# Patient Record
Sex: Male | Born: 1980 | Race: White | Hispanic: No | Marital: Single | Smoking: Never smoker
Health system: Southern US, Community
[De-identification: ages and names within clinical notes are randomized; demographics above are authoritative.]

---

## 2011-03-19 DIAGNOSIS — K7581 Nonalcoholic steatohepatitis (NASH): Secondary | ICD-10-CM

## 2011-03-19 DIAGNOSIS — I10 Essential (primary) hypertension: Secondary | ICD-10-CM

## 2011-03-19 HISTORY — DX: Essential (primary) hypertension: I10

## 2011-03-19 HISTORY — DX: Nonalcoholic steatohepatitis (NASH): K75.81

## 2011-04-19 DIAGNOSIS — R6889 Other general symptoms and signs: Secondary | ICD-10-CM | POA: Insufficient documentation

## 2011-05-05 ENCOUNTER — Emergency Department (HOSPITAL_COMMUNITY)
Admission: EM | Admit: 2011-05-05 | Discharge: 2011-05-05 | Disposition: A | Discharge status: Home / Self Care | Payer: BC Managed Care – PPO | Attending: Emergency Medicine | Admitting: Emergency Medicine

## 2011-05-05 ENCOUNTER — Encounter (HOSPITAL_COMMUNITY): Payer: Self-pay | Admitting: *Deleted

## 2011-05-05 ENCOUNTER — Emergency Department (HOSPITAL_COMMUNITY): Payer: BC Managed Care – PPO

## 2011-05-05 DIAGNOSIS — R109 Unspecified abdominal pain: Secondary | ICD-10-CM | POA: Insufficient documentation

## 2011-05-05 DIAGNOSIS — K2990 Gastroduodenitis, unspecified, without bleeding: Secondary | ICD-10-CM | POA: Insufficient documentation

## 2011-05-05 DIAGNOSIS — K2970 Gastritis, unspecified, without bleeding: Secondary | ICD-10-CM | POA: Insufficient documentation

## 2011-05-05 DIAGNOSIS — R10819 Abdominal tenderness, unspecified site: Secondary | ICD-10-CM | POA: Insufficient documentation

## 2011-05-05 DIAGNOSIS — R110 Nausea: Secondary | ICD-10-CM | POA: Insufficient documentation

## 2011-05-05 DIAGNOSIS — Z79899 Other long term (current) drug therapy: Secondary | ICD-10-CM | POA: Insufficient documentation

## 2011-05-05 LAB — URINALYSIS, ROUTINE W REFLEX MICROSCOPIC
Leukocytes, UA: NEGATIVE
Nitrite: NEGATIVE
Specific Gravity, Urine: 1.01 (ref 1.005–1.030)
pH: 7 (ref 5.0–8.0)

## 2011-05-05 LAB — COMPREHENSIVE METABOLIC PANEL
Albumin: 4.8 g/dL (ref 3.5–5.2)
Alkaline Phosphatase: 55 U/L (ref 39–117)
BUN: 14 mg/dL (ref 6–23)
Creatinine, Ser: 0.87 mg/dL (ref 0.50–1.35)
Potassium: 3.9 mEq/L (ref 3.5–5.1)
Total Protein: 7.8 g/dL (ref 6.0–8.3)

## 2011-05-05 LAB — CBC
Hemoglobin: 15.4 g/dL (ref 13.0–17.0)
RBC: 5.06 MIL/uL (ref 4.22–5.81)
WBC: 6.6 10*3/uL (ref 4.0–10.5)

## 2011-05-05 LAB — LIPASE, BLOOD: Lipase: 21 U/L (ref 11–59)

## 2011-05-05 MED ORDER — TRAMADOL HCL 50 MG PO TABS: 50.0000 mg | ORAL_TABLET | Freq: Four times a day (QID) | ORAL | Status: AC | PRN

## 2011-05-05 MED ORDER — ONDANSETRON HCL 4 MG PO TABS: 4.0000 mg | ORAL_TABLET | Freq: Four times a day (QID) | ORAL | Status: AC

## 2011-05-05 MED ORDER — GI COCKTAIL ~~LOC~~
30.0000 mL | Freq: Once | ORAL | Status: AC
  Administered 2011-05-05: 30 mL via ORAL
  Filled 2011-05-05: qty 30

## 2011-05-05 MED ORDER — PANTOPRAZOLE SODIUM 20 MG PO TBEC: 40.0000 mg | DELAYED_RELEASE_TABLET | Freq: Every day | ORAL | Status: DC

## 2011-05-05 MED ORDER — PANTOPRAZOLE SODIUM 40 MG IV SOLR
40.0000 mg | Freq: Once | INTRAVENOUS | Status: AC
  Administered 2011-05-05: 40 mg via INTRAVENOUS
  Filled 2011-05-05: qty 40

## 2011-05-05 NOTE — ED Notes (Signed)
MD at bedside. 

## 2011-05-05 NOTE — ED Notes (Signed)
Patient given discharge instructions, information, prescriptions, and diet order. Patient states that they adequately understand discharge information given and to return to ED if symptoms return or worsen.     

## 2011-05-05 NOTE — ED Provider Notes (Signed)
History     CSN: [id number]  Arrival date & time 05/05/11  1030   First MD Initiated Contact with Patient 05/05/11 1046      Chief Complaint  Patient presents with  . Abdominal Pain    (Consider location/radiation/quality/duration/timing/severity/associated sxs/prior treatment) The history is provided by the patient.  Pt p/w epigastric and RUQ pain x 3 days worse when eating and associated with mild nausea. No fever, chills, V/D, urinary symptoms. ? Dark stools. No frank blood in stool. Pain mild currently. No prev abdominal surgeries.   History reviewed. No pertinent past medical history.  History reviewed. No pertinent past surgical history.  History reviewed. No pertinent family history.  History  Substance Use Topics  . Smoking status: Never Smoker   . Smokeless tobacco: Not on file  . Alcohol Use: Yes     social      Review of Systems  Constitutional: Negative for fever and chills.  Respiratory: Negative for chest tightness and shortness of breath.   Cardiovascular: Negative for chest pain and palpitations.  Gastrointestinal: Positive for abdominal pain. Negative for nausea, vomiting, diarrhea, constipation, blood in stool and abdominal distention.  Musculoskeletal: Negative for myalgias, back pain and arthralgias.  Skin: Negative for color change and rash.  Neurological: Negative for weakness, numbness and headaches.    Allergies  Review of patient's allergies indicates no known allergies.  Home Medications   Current Outpatient Rx  Name Route Sig Dispense Refill  . ACETAMINOPHEN 500 MG PO TABS Oral Take 1,000 mg by mouth every 6 (six) hours as needed. For pain.    Marland Kitchen OMEGA-3 FATTY ACIDS 1000 MG PO CAPS Oral Take 1 g by mouth 3 (three) times daily.    Marland Kitchen FLAX SEED OIL PO Oral Take 1 capsule by mouth 3 (three) times daily.    . IBUPROFEN 200 MG PO TABS Oral Take 400-600 mg by mouth every 8 (eight) hours as needed. For pain.    Marland Kitchen MILK THISTLE PO Oral Take 1  capsule by mouth 3 (three) times daily.    . ADULT MULTIVITAMIN W/MINERALS CH Oral Take 1 tablet by mouth daily.    Marland Kitchen ONDANSETRON HCL 4 MG PO TABS Oral Take 1 tablet (4 mg total) by mouth every 6 (six) hours. 12 tablet 0  . PANTOPRAZOLE SODIUM 20 MG PO TBEC Oral Take 2 tablets (40 mg total) by mouth daily. 30 tablet 0  . TRAMADOL HCL 50 MG PO TABS Oral Take 1 tablet (50 mg total) by mouth every 6 (six) hours as needed for pain. 15 tablet 0    BP 146/75  Pulse 86  Temp(Src) 97.6 F (36.4 C) (Oral)  Resp 18  SpO2 100%  Physical Exam  Nursing note and vitals reviewed. Constitutional: He is oriented to person, place, and time. He appears well-developed and well-nourished. No distress.  HENT:  Head: Normocephalic and atraumatic.  Mouth/Throat: Oropharynx is clear and moist.  Eyes: EOM are normal. Pupils are equal, round, and reactive to light.  Neck: Normal range of motion. Neck supple.  Cardiovascular: Normal rate and regular rhythm.   Pulmonary/Chest: Effort normal and breath sounds normal. No respiratory distress. He has no wheezes. He has no rales.  Abdominal: Soft. Bowel sounds are normal. He exhibits no distension and no mass. There is tenderness (Tenderness just superior to umbilicus and RUQ. no rebound or guarding). There is no rebound and no guarding.  Genitourinary: Rectum normal. Guaiac negative stool.  Musculoskeletal: Normal range of motion. He exhibits  no edema and no tenderness.  Neurological: He is alert and oriented to person, place, and time.  Skin: Skin is warm and dry. No rash noted. No erythema.  Psychiatric: He has a normal mood and affect. His behavior is normal.    ED Course  Procedures (including critical care time)  Labs Reviewed  COMPREHENSIVE METABOLIC PANEL - Abnormal; Notable for the following:    Glucose, Bld 103 (*)    All other components within normal limits  OCCULT BLOOD, POC DEVICE  LIPASE, BLOOD  URINALYSIS, ROUTINE W REFLEX MICROSCOPIC  CBC    CBC  DIFFERENTIAL  OCCULT BLOOD X 1 CARD TO LAB, STOOL   US Abdomen Complete  05/05/2011  *RADIOLOGY REPORT*  Clinical Data:  Right upper quadrant pain epigastric pain.  Nausea.  COMPLETE ABDOMINAL ULTRASOUND  Comparison:  None.  Findings:  Gallbladder:  No gallstones, gallbladder wall thickening, or pericholecystic fluid.  Common bile duct:  Between 4 mm and 5 mm, normal.  Liver:  Echogenic liver with mildly heterogeneous echotexture, most compatible with hepatic steatosis.  No focal mass lesions.  No intrahepatic biliary ductal dilation.  IVC:  Appears normal.  Pancreas:  Obscured by overlying bowel gas.  Spleen:  46 mm with normal echotexture.  Right Kidney:  11.3 cm. Normal echotexture.  Normal central sinus echo complex.  No calculi or hydronephrosis.  Left Kidney:  11.9 cm.  Probable extrarenal pelvis.  Normal echotexture.  No calculi.  Abdominal aorta:  2 cm, normal.  IMPRESSION:  1.  Negative for cholelithiasis or cholecystitis. 2.  Echogenic liver, likely representing hepatic steatosis.  Original Report Authenticated By: Andreas Newport, M.D.     1. Gastritis       MDM    Pt states he is feeling better. F/u with gi if sx persist. Return for concerns  Loren Racer, MD 05/05/11 [phone]

## 2011-05-05 NOTE — ED Notes (Signed)
Ultrasound tech in to perform abdominal ultrasound.

## 2011-05-05 NOTE — ED Notes (Signed)
Pt sts he was recently treated for stomach flu 2 weeks ago. Sts abd pain began Friday and got more intense last night. This morning sts he had BM and ate fiber bar but that the pain is still present. Pain above umbilicus and radiates to his right lower abdomen.

## 2011-07-15 ENCOUNTER — Ambulatory Visit: Payer: BC Managed Care – PPO

## 2011-07-15 ENCOUNTER — Ambulatory Visit (INDEPENDENT_AMBULATORY_CARE_PROVIDER_SITE_OTHER): Payer: BC Managed Care – PPO | Admitting: Emergency Medicine

## 2011-07-15 VITALS — BP 158/79 | HR 80 | Temp 98.4°F | Resp 18 | Ht 71.75 in | Wt 226.8 lb

## 2011-07-15 DIAGNOSIS — Z0000 Encounter for general adult medical examination without abnormal findings: Secondary | ICD-10-CM

## 2011-07-15 LAB — LIPID PANEL
Cholesterol: 200 mg/dL (ref 0–200)
LDL Cholesterol: 120 mg/dL — ABNORMAL HIGH (ref 0–99)
VLDL: 29 mg/dL (ref 0–40)

## 2011-07-15 LAB — POCT URINALYSIS DIPSTICK
Blood, UA: NEGATIVE
Glucose, UA: NEGATIVE
Nitrite, UA: NEGATIVE
Protein, UA: NEGATIVE
Urobilinogen, UA: 0.2

## 2011-07-15 LAB — COMPREHENSIVE METABOLIC PANEL
ALT: 18 U/L (ref 0–53)
AST: 21 U/L (ref 0–37)
Albumin: 5.3 g/dL — ABNORMAL HIGH (ref 3.5–5.2)
Alkaline Phosphatase: 54 U/L (ref 39–117)
BUN: 13 mg/dL (ref 6–23)
Calcium: 9.9 mg/dL (ref 8.4–10.5)
Chloride: 102 mEq/L (ref 96–112)
Potassium: 4.1 mEq/L (ref 3.5–5.3)
Sodium: 139 mEq/L (ref 135–145)
Total Protein: 7.6 g/dL (ref 6.0–8.3)

## 2011-07-15 LAB — POCT CBC
Lymph, poc: 1.5 (ref 0.6–3.4)
MCH, POC: 30.5 pg (ref 27–31.2)
MCHC: 34 g/dL (ref 31.8–35.4)
MID (cbc): 0.4 (ref 0–0.9)
MPV: 9.7 fL (ref 0–99.8)
POC MID %: 8 %M (ref 0–12)
Platelet Count, POC: 190 10*3/uL (ref 142–424)
WBC: 5.6 10*3/uL (ref 4.6–10.2)

## 2011-07-15 LAB — POCT UA - MICROSCOPIC ONLY
Casts, Ur, LPF, POC: NEGATIVE
Crystals, Ur, HPF, POC: NEGATIVE
Mucus, UA: NEGATIVE
Yeast, UA: NEGATIVE

## 2011-07-15 MED ORDER — OMEPRAZOLE 20 MG PO CPDR: 20.0000 mg | DELAYED_RELEASE_CAPSULE | Freq: Every day | ORAL | Status: DC

## 2011-07-15 NOTE — Patient Instructions (Addendum)
Hypertension As your heart beats, it forces blood through your arteries. This force is your blood pressure. If the pressure is too high, it is called hypertension (HTN) or high blood pressure. HTN is dangerous because you may have it and not know it. High blood pressure may mean that your heart has to work harder to pump blood. Your arteries may be narrow or stiff. The extra work puts you at risk for heart disease, stroke, and other problems.  Blood pressure consists of two numbers, a higher number over a lower, 110/72, for example. It is stated as "110 over 72." The ideal is below 120 for the top number (systolic) and under 80 for the bottom (diastolic). Write down your blood pressure today. You should pay close attention to your blood pressure if you have certain conditions such as:  Heart failure.   Prior heart attack.   Diabetes   Chronic kidney disease.   Prior stroke.   Multiple risk factors for heart disease.  To see if you have HTN, your blood pressure should be measured while you are seated with your arm held at the level of the heart. It should be measured at least twice. A one-time elevated blood pressure reading (especially in the Emergency Department) does not mean that you need treatment. There may be conditions in which the blood pressure is different between your right and left arms. It is important to see your caregiver soon for a recheck. Most people have essential hypertension which means that there is not a specific cause. This type of high blood pressure may be lowered by changing lifestyle factors such as:  Stress.   Smoking.   Lack of exercise.   Excessive weight.   Drug/tobacco/alcohol use.   Eating less salt.  Most people do not have symptoms from high blood pressure until it has caused damage to the body. Effective treatment can often prevent, delay or reduce that damage. TREATMENT  When a cause has been identified, treatment for high blood pressure is  directed at the cause. There are a large number of medications to treat HTN. These fall into several categories, and your caregiver will help you select the medicines that are best for you. Medications may have side effects. You should review side effects with your caregiver. If your blood pressure stays high after you have made lifestyle changes or started on medicines,   Your medication(s) may need to be changed.   Other problems may need to be addressed.   Be certain you understand your prescriptions, and know how and when to take your medicine.   Be sure to follow up with your caregiver within the time frame advised (usually within two weeks) to have your blood pressure rechecked and to review your medications.   If you are taking more than one medicine to lower your blood pressure, make sure you know how and at what times they should be taken. Taking two medicines at the same time can result in blood pressure that is too low.  SEEK IMMEDIATE MEDICAL CARE IF:  You develop a severe headache, blurred or changing vision, or confusion.   You have unusual weakness or numbness, or a faint feeling.   You have severe chest or abdominal pain, vomiting, or breathing problems.  MAKE SURE YOU:   Understand these instructions.   Will watch your condition.   Will get help right away if you are not doing well or get worse.  Document Released: 03/04/2005 Document Revised: 02/21/2011 Document Reviewed:   10/23/2007 [employer] Patient Information 2012 [place], [place].Gastroesophageal Reflux Disease, Adult Gastroesophageal reflux disease (GERD) happens when acid from your stomach flows up into the esophagus. When acid comes in contact with the esophagus, the acid causes soreness (inflammation) in the esophagus. Over time, GERD may create small holes (ulcers) in the lining of the esophagus. CAUSES   Increased body weight. This puts pressure on the stomach, making acid rise from the stomach into the  esophagus.   Smoking. This increases acid production in the stomach.   Drinking alcohol. This causes decreased pressure in the lower esophageal sphincter (valve or ring of muscle between the esophagus and stomach), allowing acid from the stomach into the esophagus.   Late evening meals and a full stomach. This increases pressure and acid production in the stomach.   A malformed lower esophageal sphincter.  Sometimes, no cause is found. SYMPTOMS   Burning pain in the lower part of the mid-chest behind the breastbone and in the mid-stomach area. This may occur twice a week or more often.   Trouble swallowing.   Sore throat.   Dry cough.   Asthma-like symptoms including chest tightness, shortness of breath, or wheezing.  DIAGNOSIS  Your caregiver may be able to diagnose GERD based on your symptoms. In some cases, X-rays and other tests may be done to check for complications or to check the condition of your stomach and esophagus. TREATMENT  Your caregiver may recommend over-the-counter or prescription medicines to help decrease acid production. Ask your caregiver before starting or adding any new medicines.  HOME CARE INSTRUCTIONS   Change the factors that you can control. Ask your caregiver for guidance concerning weight loss, quitting smoking, and alcohol consumption.   Avoid foods and drinks that make your symptoms worse, such as:   Caffeine or alcoholic drinks.   Chocolate.   Peppermint or mint flavorings.   Garlic and onions.   Spicy foods.   Citrus fruits, such as oranges, lemons, or limes.   Tomato-based foods such as sauce, chili, salsa, and pizza.   Fried and fatty foods.   Avoid lying down for the 3 hours prior to your bedtime or prior to taking a nap.   Eat small, frequent meals instead of large meals.   Wear loose-fitting clothing. Do not wear anything tight around your waist that causes pressure on your stomach.   Raise the head of your bed 6 to 8 inches  with wood blocks to help you sleep. Extra pillows will not help.   Only take over-the-counter or prescription medicines for pain, discomfort, or fever as directed by your caregiver.   Do not take aspirin, ibuprofen, or other nonsteroidal anti-inflammatory drugs (NSAIDs).  SEEK IMMEDIATE MEDICAL CARE IF:   You have pain in your arms, neck, jaw, teeth, or back.   Your pain increases or changes in intensity or duration.   You develop nausea, vomiting, or sweating (diaphoresis).   You develop shortness of breath, or you faint.   Your vomit is green, yellow, black, or looks like coffee grounds or blood.   Your stool is red, bloody, or black.  These symptoms could be signs of other problems, such as heart disease, gastric bleeding, or esophageal bleeding. MAKE SURE YOU:   Understand these instructions.   Will watch your condition.   Will get help right away if you are not doing well or get worse.  Document Released: 12/12/2004 Document Revised: 02/21/2011 Document Reviewed: 09/21/2010 [facility] Patient Information 2012 [place], [place].

## 2011-07-15 NOTE — Progress Notes (Signed)
Subjective:    Patient ID: Erik Boyd, male    DOB: [birth year], 31 y.o.   MRN: [id number]  HPI patient here for his annual physical exam. He's been told in the past he needs to lose weight exercise to keep his blood pressure down. He is a bad family history of hypertension high cholesterol and diabetes and knows he is at risk for these medical problems. He has been fairly sedentary recently because he has to travel 3-4 days a week and has been staying in hotels.    Review of Systems  Constitutional: Negative.   HENT: Negative.   Eyes: Negative.   Respiratory: Negative.   Cardiovascular:       He has been experiencing an upper chest discomfort associated with dyspepsia primarily in the morning. He's been to the emergency room for evaluation and was started on Protonix which she took for approximately 10 days.  Gastrointestinal:       As stated above he has been to the emergency room in the reflux esophagitis with Protonix for 10 days. He improved with this medication but symptoms returned immediately upon stopping the medication.       Objective:   Physical Exam  Constitutional: He appears well-developed and well-nourished.  HENT:  Head: Normocephalic.  Right Ear: External ear normal.  Left Ear: External ear normal.  Eyes: Pupils are equal, round, and reactive to light.  Neck: No tracheal deviation present. No thyromegaly present.  Cardiovascular: Normal rate and regular rhythm.  Exam reveals no gallop and no friction rub.   No murmur heard. Pulmonary/Chest: No respiratory distress. He has no rales. He exhibits no tenderness.  Abdominal: Soft. Bowel sounds are normal. He exhibits no distension and no mass. There is no tenderness. There is no guarding.  Genitourinary: Penis normal.  Musculoskeletal: He exhibits no edema.  Neurological: He is alert. No cranial nerve deficit.  Skin: Skin is dry.  Psychiatric: He has a normal mood and affect. His behavior is normal. Judgment and  thought content normal.  [facility] reading (PRIMARY) by  Dr.Berlyn Malina no acute disease .   Results for orders placed in visit on 07/15/11  POCT CBC      Component Value Range   WBC 5.6  4.6 - 10.2 (K/uL)   Lymph, poc 1.5  0.6 - 3.4    POC LYMPH PERCENT 26.5  10 - 50 (%L)   MID (cbc) 0.4  0 - 0.9    POC MID % 8.0  0 - 12 (%M)   POC Granulocyte 3.7  2 - 6.9    Granulocyte percent 65.5  37 - 80 (%G)   RBC 4.75  4.69 - 6.13 (M/uL)   Hemoglobin 14.5  14.1 - 18.1 (g/dL)   HCT, POC 22.0 (*) 25.4 - 53.7 (%)   MCV 89.7  80 - 97 (fL)   MCH, POC 30.5  27 - 31.2 (pg)   MCHC 34.0  31.8 - 35.4 (g/dL)   RDW, POC 27.0     Platelet Count, POC 190  142 - 424 (K/uL)   MPV 9.7  0 - 99.8 (fL)  POCT UA - MICROSCOPIC ONLY      Component Value Range   WBC, Ur, HPF, POC neg     RBC, urine, microscopic neg     Bacteria, U Microscopic neg     Mucus, UA neg     Epithelial cells, urine per micros 0-2     Crystals, Ur, HPF, POC neg  Casts, Ur, LPF, POC neg     Yeast, UA neg    POCT URINALYSIS DIPSTICK      Component Value Range   Color, UA yellow     Clarity, UA clear     Glucose, UA neg     Bilirubin, UA neg     Ketones, UA neg     Spec Grav, UA 1.015     Blood, UA neg     pH, UA 7.5     Protein, UA neg     Urobilinogen, UA 0.2     Nitrite, UA neg     Leukocytes, UA Negative          Assessment & Plan:   Patient is in reasonably good health except he is not exercising enough needs to lose weight and follow a stricter diet. He is aware he needs to do all these veins he just has not been able to do those things because of his work schedule.Marland Kitchen

## 2011-07-16 ENCOUNTER — Encounter: Payer: Self-pay | Admitting: *Deleted

## 2011-07-22 ENCOUNTER — Ambulatory Visit (INDEPENDENT_AMBULATORY_CARE_PROVIDER_SITE_OTHER): Payer: BC Managed Care – PPO | Admitting: Family Medicine

## 2011-07-22 VITALS — BP 150/87 | HR 94 | Temp 98.3°F | Resp 18 | Ht 71.5 in | Wt 224.4 lb

## 2011-07-22 DIAGNOSIS — H6690 Otitis media, unspecified, unspecified ear: Secondary | ICD-10-CM

## 2011-07-22 DIAGNOSIS — J329 Chronic sinusitis, unspecified: Secondary | ICD-10-CM

## 2011-07-22 MED ORDER — AMOXICILLIN 875 MG PO TABS: 875.0000 mg | ORAL_TABLET | Freq: Two times a day (BID) | ORAL | Status: AC

## 2011-07-22 NOTE — Progress Notes (Signed)
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 These symptoms are associated with a low-grade fever.  No nausea or vomiting  Objective: HEENT: Patient has reddened throat, postnasal drainage, and reddened right year.  Neck: Supple no adenopathy or thyromegaly  Chest: Clear  Heart: Regular no murmur  Skin: Clear  Assessment: Sinusitis and otitis media, progressive  Plan amoxicillin 875 twice a day x10 days

## 2012-06-01 ENCOUNTER — Telehealth: Payer: Self-pay

## 2012-06-01 NOTE — Telephone Encounter (Signed)
Complete form stating pt had PE/labs on 07/15/11 and signed by Dr Cleta Alberts. LMOM for pt that ready for p/up.

## 2012-12-23 IMAGING — US US ABDOMEN COMPLETE
1 series · 14 of 25 positions shown · non-contrast
Comparison: None.

CLINICAL DATA: Right upper quadrant pain epigastric pain.  Nausea.

COMPLETE ABDOMINAL ULTRASOUND

[Series 1: us abdomen complete · 0.32mm/px · 14 of 76 slices shown]
[im 1/76]
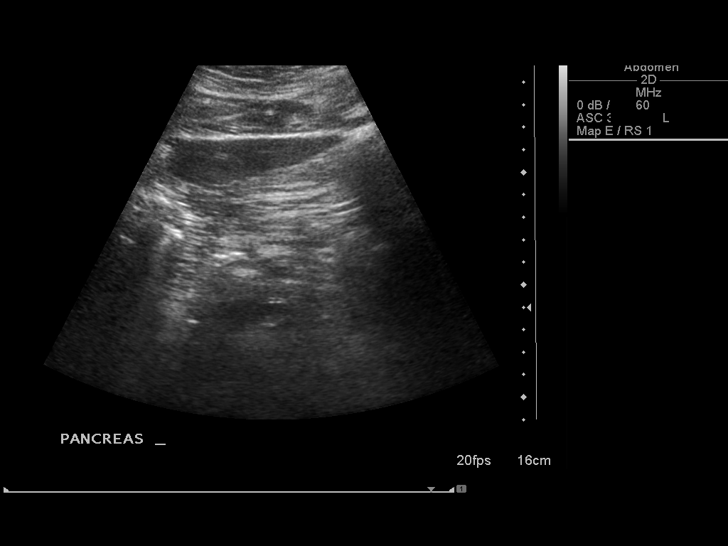
[im 7/76]
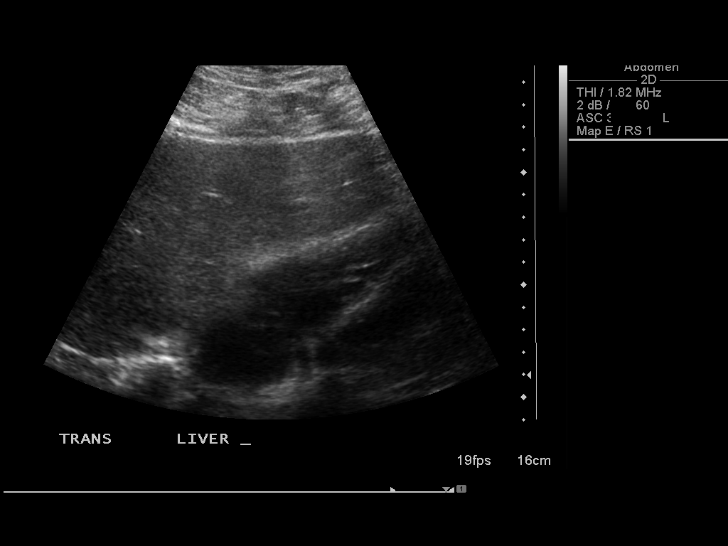
[im 13/76]
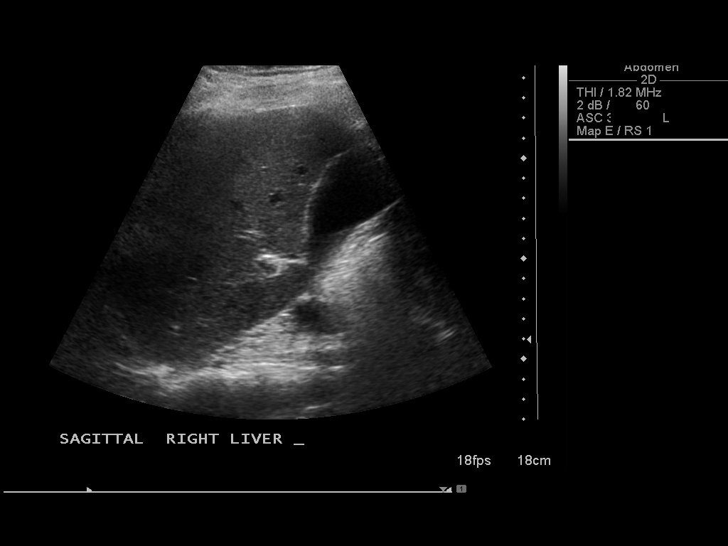
[im 19/76]
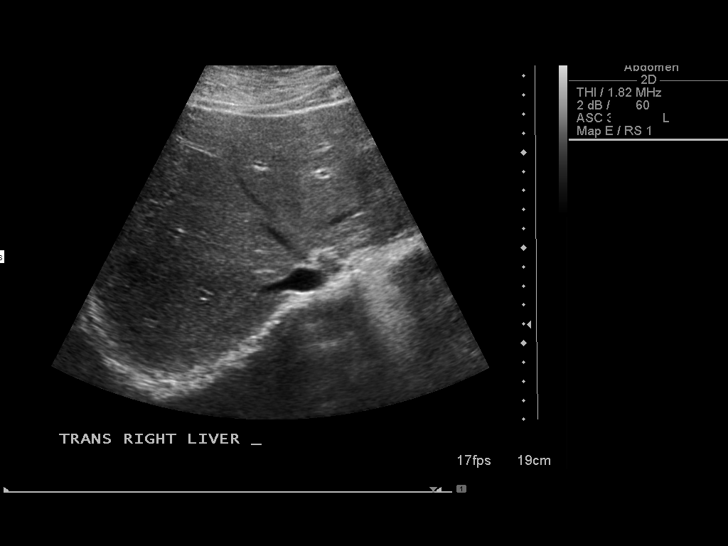
[im 26/76]
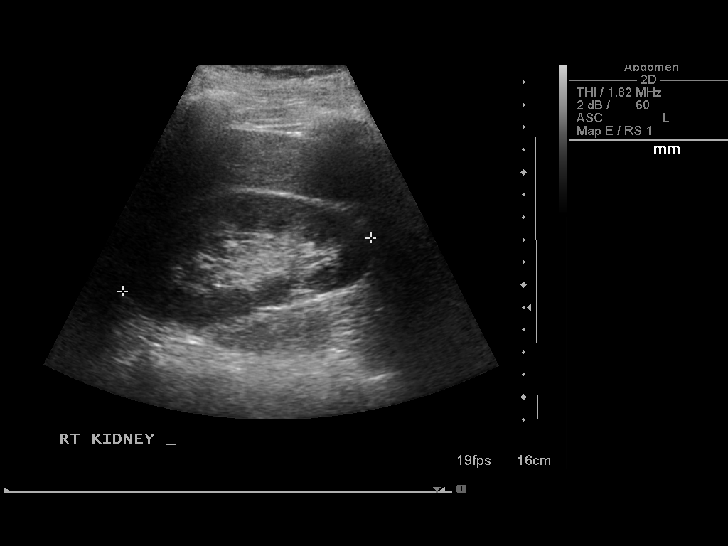
[im 29/76]
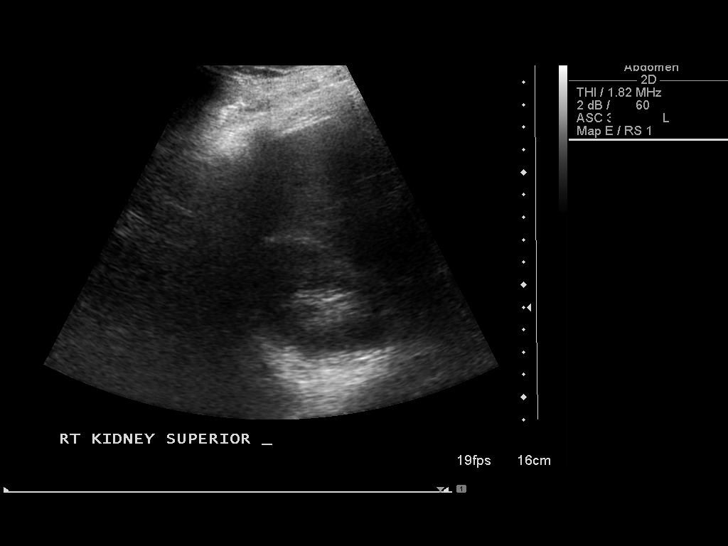
[im 35/76]
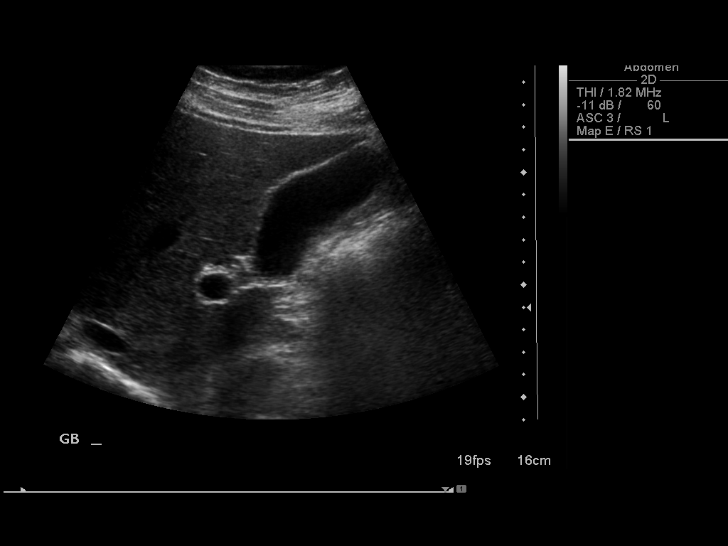
[im 41/76]
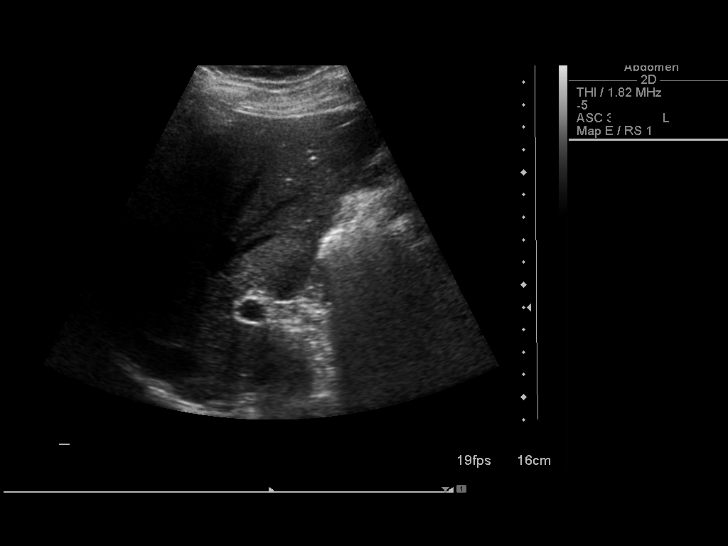
[im 47/76]
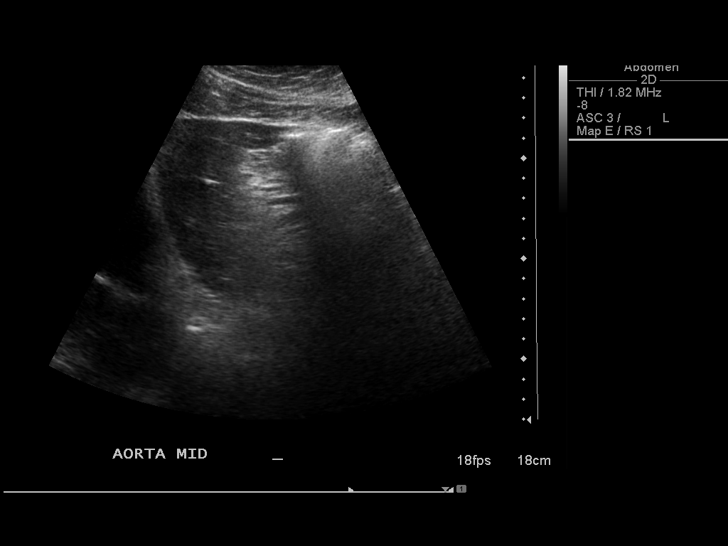
[im 51/76]
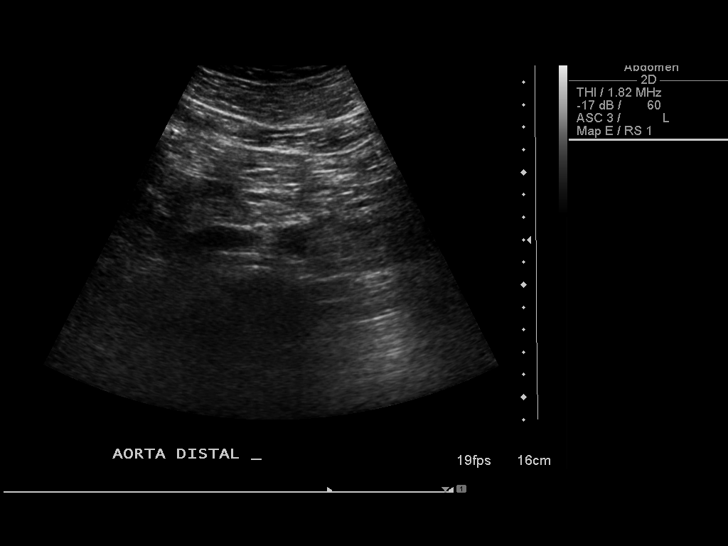
[im 57/76]
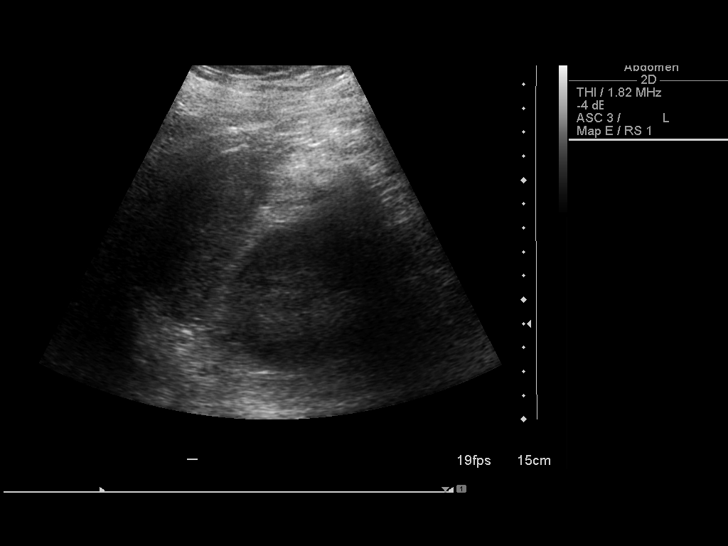
[im 63/76]
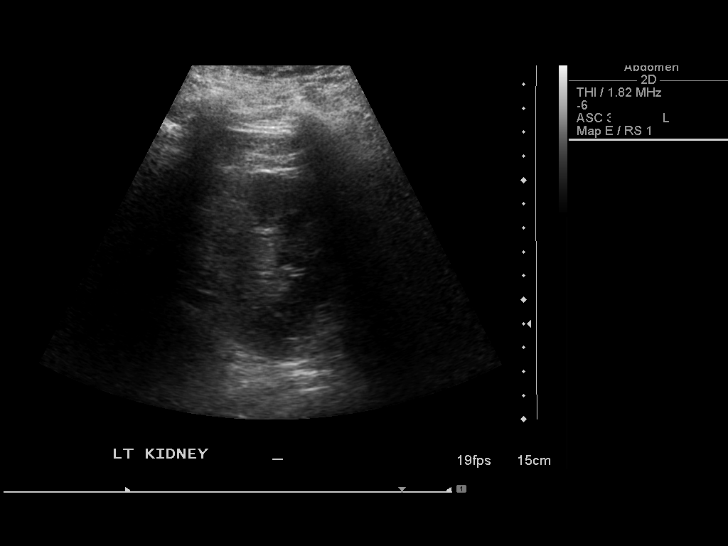
[im 69/76]
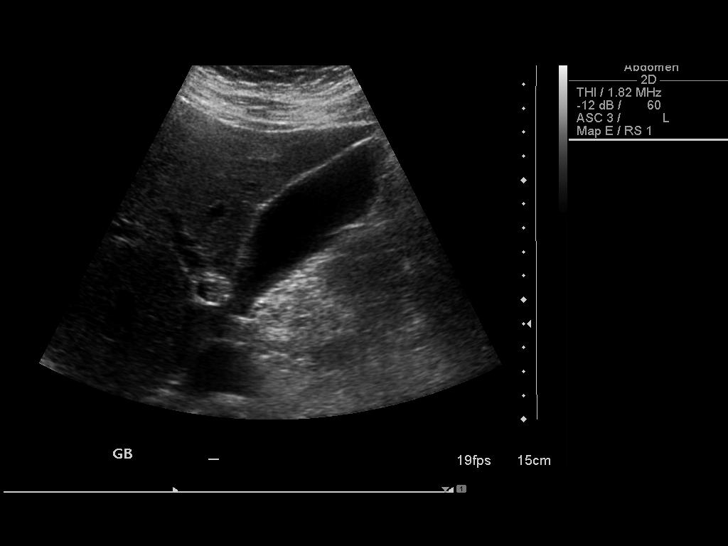
[im 76/76]
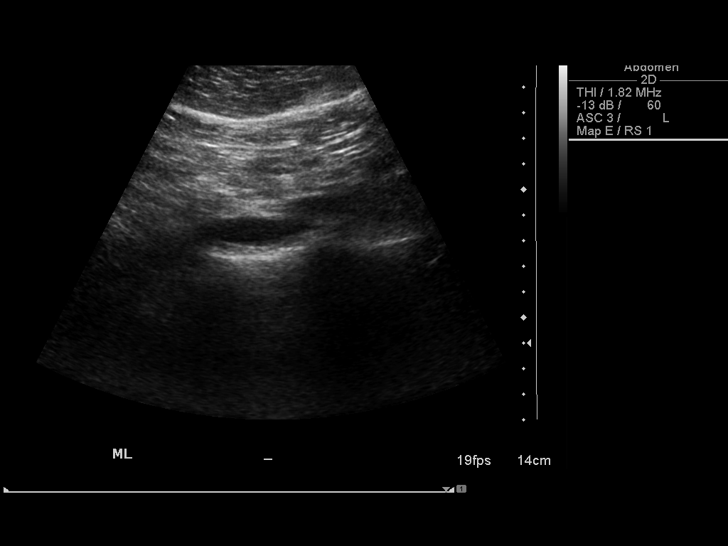

[14 of 25 positions shown; findings below may reference images not displayed]

FINDINGS: Gallbladder:  No gallstones, gallbladder wall thickening, or
pericholecystic fluid.

Common bile duct:  Between 4 mm and 5 mm, normal.

Liver:  Echogenic liver with mildly heterogeneous echotexture, most
compatible with hepatic steatosis.  No focal mass lesions.  No
intrahepatic biliary ductal dilation.

IVC:  Appears normal.

Pancreas:  Obscured by overlying bowel gas.

Spleen:  46 mm with normal echotexture.

Right Kidney:  11.3 cm. Normal echotexture.  Normal central sinus
echo complex.  No calculi or hydronephrosis.

Left Kidney:  11.9 cm.  Probable extrarenal pelvis.  Normal
echotexture.  No calculi.

Abdominal aorta:  2 cm, normal.
IMPRESSION: 1.  Negative for cholelithiasis or cholecystitis.
2.  Echogenic liver, likely representing hepatic steatosis.

## 2013-03-04 IMAGING — CR DG CHEST 2V
2 series · 2 of 2 positions shown · non-contrast
Comparison: None.

CLINICAL DATA: Chest pain.

CHEST - 2 VIEW

[PA]
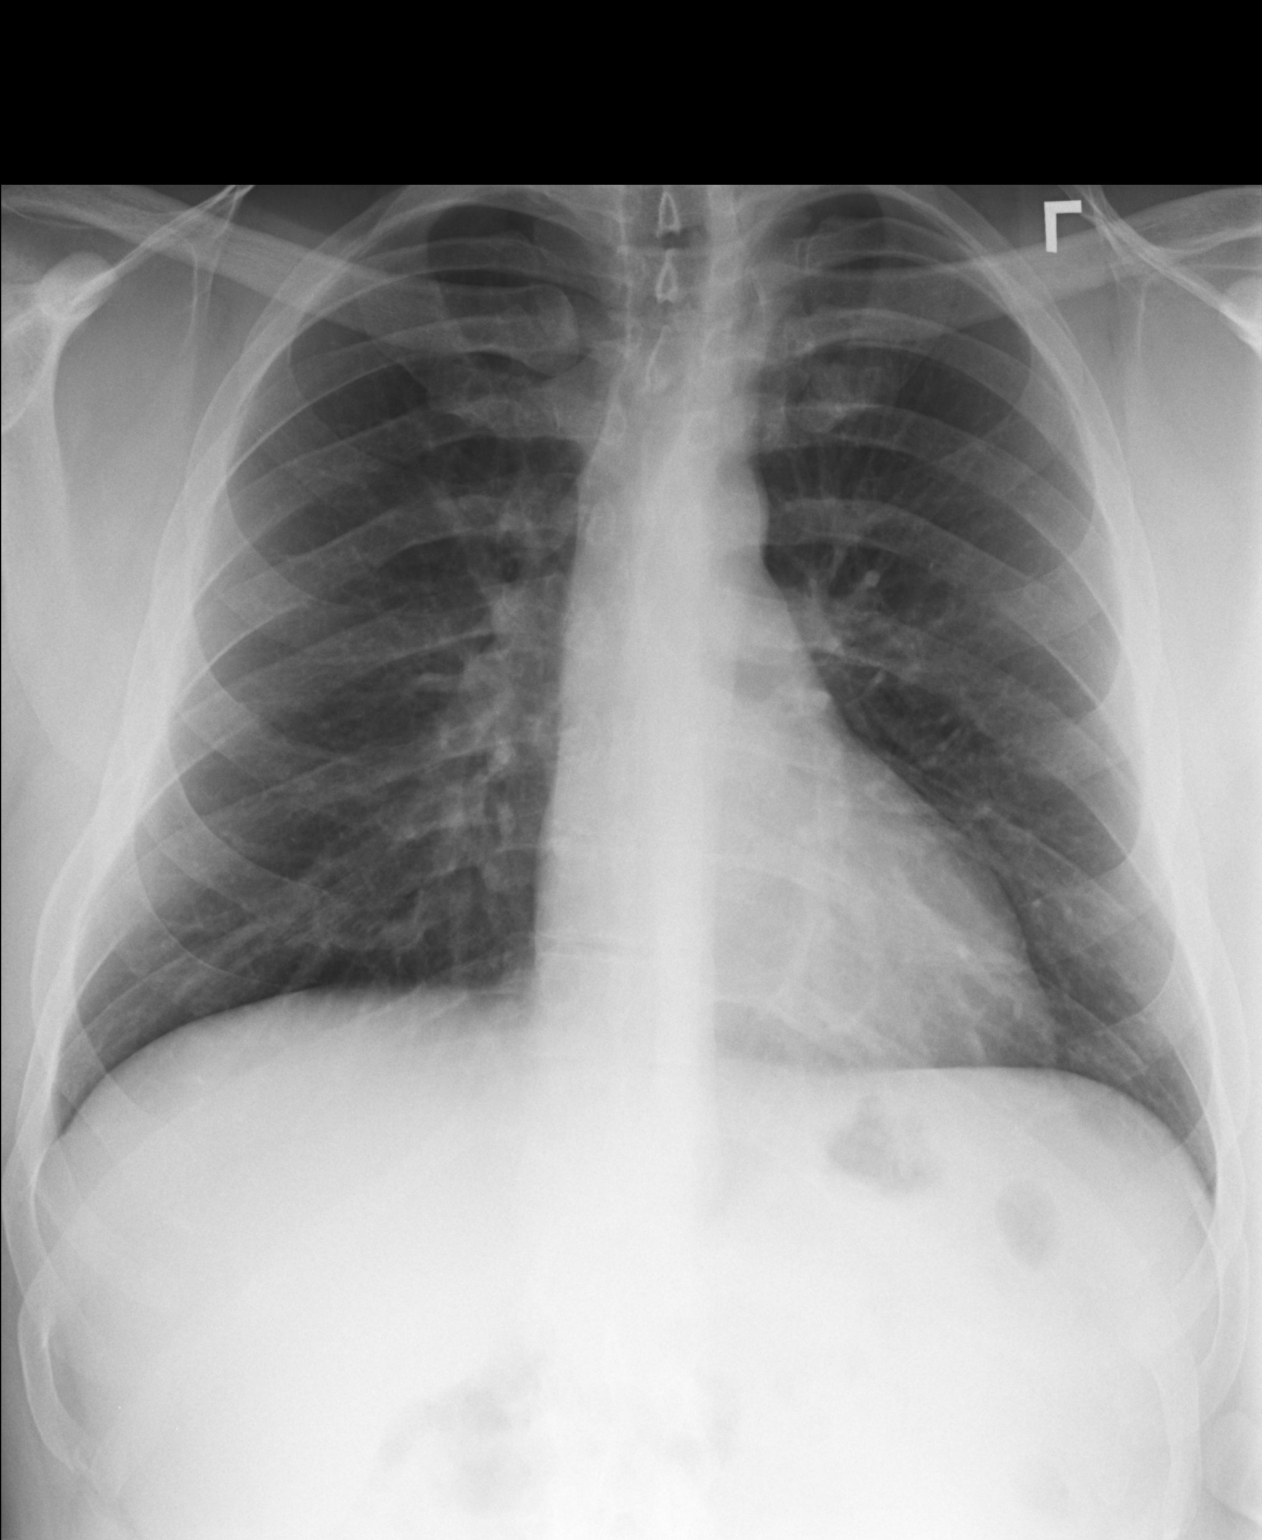

[lateral]
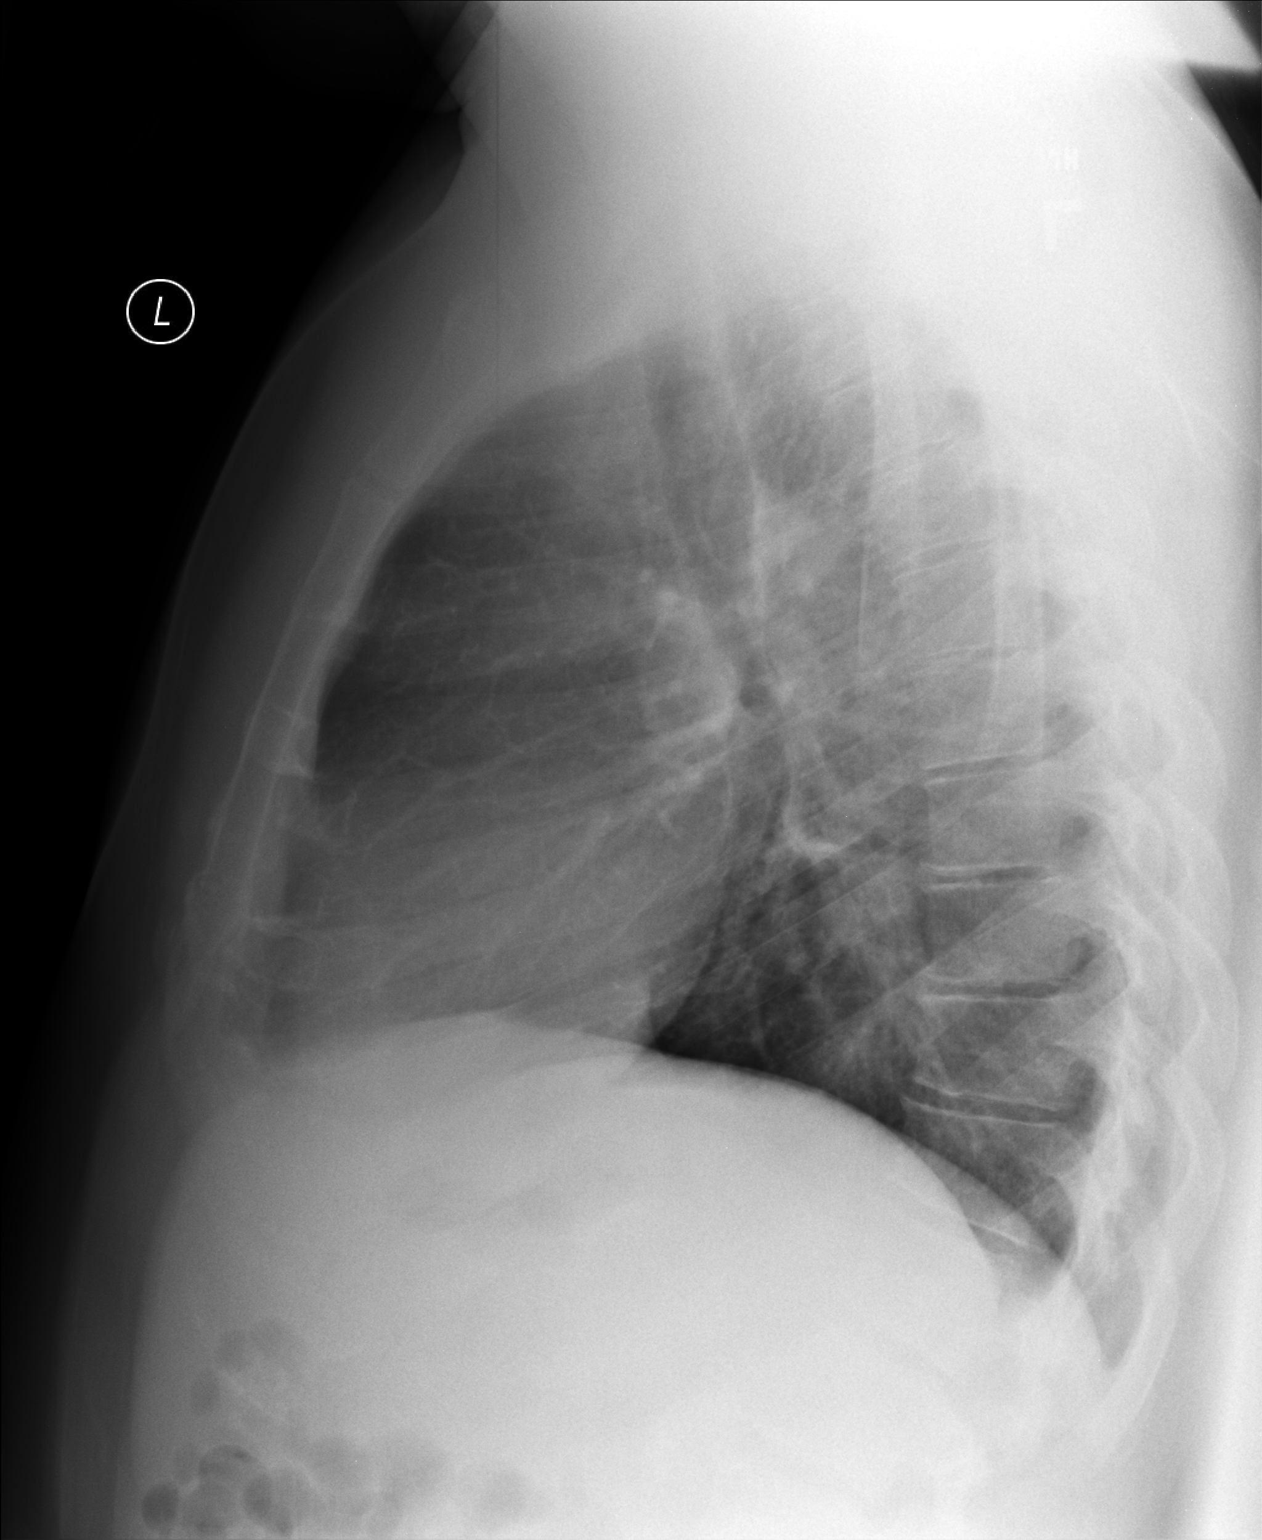

[2 of 2 positions shown; findings below may reference images not displayed]

FINDINGS: Cardiac and mediastinal contours appear normal.

The lungs appear clear.

No pleural effusion is identified.  Physiologic wedging at T12
noted..
IMPRESSION: 1.  No significant abnormality is observed.

## 2018-10-20 DIAGNOSIS — Z6836 Body mass index (BMI) 36.0-36.9, adult: Secondary | ICD-10-CM | POA: Diagnosis not present

## 2018-10-20 DIAGNOSIS — Z20828 Contact with and (suspected) exposure to other viral communicable diseases: Secondary | ICD-10-CM | POA: Diagnosis not present

## 2019-02-27 DIAGNOSIS — I10 Essential (primary) hypertension: Secondary | ICD-10-CM | POA: Insufficient documentation

## 2019-03-01 ENCOUNTER — Other Ambulatory Visit: Payer: Self-pay

## 2019-03-01 DIAGNOSIS — Z20822 Contact with and (suspected) exposure to covid-19: Secondary | ICD-10-CM

## 2019-03-03 LAB — NOVEL CORONAVIRUS, NAA: SARS-CoV-2, NAA: NOT DETECTED

## 2019-03-17 ENCOUNTER — Ambulatory Visit: Payer: BC Managed Care – PPO | Attending: Internal Medicine

## 2019-03-17 ENCOUNTER — Ambulatory Visit: Payer: Self-pay | Attending: Internal Medicine

## 2019-03-17 DIAGNOSIS — Z20828 Contact with and (suspected) exposure to other viral communicable diseases: Secondary | ICD-10-CM | POA: Insufficient documentation

## 2019-03-17 DIAGNOSIS — Z20822 Contact with and (suspected) exposure to covid-19: Secondary | ICD-10-CM

## 2019-03-18 LAB — NOVEL CORONAVIRUS, NAA: SARS-CoV-2, NAA: NOT DETECTED

## 2019-04-21 DIAGNOSIS — M2241 Chondromalacia patellae, right knee: Secondary | ICD-10-CM | POA: Diagnosis not present

## 2019-10-19 DIAGNOSIS — Z20822 Contact with and (suspected) exposure to covid-19: Secondary | ICD-10-CM | POA: Diagnosis not present

## 2019-11-15 DIAGNOSIS — Z20822 Contact with and (suspected) exposure to covid-19: Secondary | ICD-10-CM | POA: Diagnosis not present

## 2019-11-19 ENCOUNTER — Other Ambulatory Visit: Payer: Self-pay

## 2020-03-29 ENCOUNTER — Other Ambulatory Visit: Payer: Self-pay

## 2020-03-29 ENCOUNTER — Encounter: Payer: Self-pay | Admitting: Internal Medicine

## 2020-03-29 ENCOUNTER — Ambulatory Visit (INDEPENDENT_AMBULATORY_CARE_PROVIDER_SITE_OTHER): Payer: BC Managed Care – PPO | Admitting: Internal Medicine

## 2020-03-29 VITALS — BP 166/98 | HR 94 | Temp 98.3°F | Ht 71.5 in | Wt 278.0 lb

## 2020-03-29 DIAGNOSIS — I119 Hypertensive heart disease without heart failure: Secondary | ICD-10-CM

## 2020-03-29 DIAGNOSIS — K7581 Nonalcoholic steatohepatitis (NASH): Secondary | ICD-10-CM | POA: Diagnosis not present

## 2020-03-29 DIAGNOSIS — Z23 Encounter for immunization: Secondary | ICD-10-CM | POA: Diagnosis not present

## 2020-03-29 DIAGNOSIS — R0683 Snoring: Secondary | ICD-10-CM | POA: Diagnosis not present

## 2020-03-29 DIAGNOSIS — I10 Essential (primary) hypertension: Secondary | ICD-10-CM | POA: Diagnosis not present

## 2020-03-29 DIAGNOSIS — Z0000 Encounter for general adult medical examination without abnormal findings: Secondary | ICD-10-CM

## 2020-03-29 DIAGNOSIS — Z0001 Encounter for general adult medical examination with abnormal findings: Secondary | ICD-10-CM

## 2020-03-29 MED ORDER — IRBESARTAN 150 MG PO TABS: 150.0000 mg | ORAL_TABLET | Freq: Every day | ORAL | 0 refills | Status: DC

## 2020-03-29 MED ORDER — INDAPAMIDE 1.25 MG PO TABS: 1.2500 mg | ORAL_TABLET | Freq: Every day | ORAL | 0 refills | Status: DC

## 2020-03-29 NOTE — Patient Instructions (Signed)

## 2020-03-29 NOTE — Progress Notes (Signed)
Subjective:  Patient ID: Erik Boyd, male    DOB: 1980/05/10  Age: 40 y.o. MRN: [id number]  CC: Annual Exam and Hypertension  This visit occurred during the SARS-CoV-2 public health emergency.  Safety protocols were in place, including screening questions prior to the visit, additional usage of Boyd PPE, and extensive cleaning of exam room while observing appropriate contact time as indicated for disinfecting solutions.   NEW TO ME  HPI Erik Boyd presents for a CPX.  He tells me that he was recently seen elsewhere for treatment of hypertension and was prescribed amlodipine.  He tells me it has not helped lower his blood pressure.  He is active and denies any recent episodes of headache, blurred vision, chest pain, shortness of breath, or diaphoresis.  He complains of chronic fatigue and consumes energy drinks.  He also has heavy snoring and his wife thinks he stops breathing at night.  He has not recently been working much on his lifestyle modifications.   Past Medical History:  Diagnosis Date  . Hypertension 2013  . NASH (nonalcoholic steatohepatitis) 2013   History reviewed. No pertinent surgical history.  reports that he has never smoked. He has never used smokeless tobacco. He reports previous alcohol use. He reports that he does not use drugs. family history includes COPD in his mother; Hypertension in his brother, brother, and father. No Known Allergies Outpatient Medications Prior to Visit  Medication Sig Dispense Refill  . acetaminophen (TYLENOL) 500 MG tablet Take 1,000 mg by mouth every 6 (six) hours as needed. For pain.    . fish oil-omega-3 fatty acids 1000 MG capsule Take 1 g by mouth 3 (three) times daily.    . Multiple Vitamin (MULITIVITAMIN WITH MINERALS) TABS Take 1 tablet by mouth daily.    Marland Kitchen amLODipine (NORVASC) 5 MG tablet Take 5 mg by mouth daily.    . Flaxseed, Linseed, (FLAX SEED OIL PO) Take 1 capsule by mouth 3 (three) times daily.    Marland Kitchen ibuprofen  (ADVIL,MOTRIN) 200 MG tablet Take 400-600 mg by mouth every 8 (eight) hours as needed. For pain.    Marland Kitchen MILK THISTLE PO Take 1 capsule by mouth 3 (three) times daily.    . pantoprazole (PROTONIX) 20 MG tablet Take 2 tablets (40 mg total) by mouth daily. 30 tablet 0   No facility-administered medications prior to visit.    ROS Review of Systems  Constitutional: Positive for fatigue and unexpected weight change (wt gain). Negative for appetite change, chills, diaphoresis and fever.  HENT: Negative.   Eyes: Negative.   Respiratory: Negative for cough, chest tightness, shortness of breath and wheezing.   Cardiovascular: Negative for chest pain, palpitations and leg swelling.  Gastrointestinal: Negative for abdominal pain, constipation, diarrhea, nausea and vomiting.  Endocrine: Negative.   Genitourinary: Negative.  Negative for difficulty urinating, penile swelling, scrotal swelling, testicular pain and urgency.  Musculoskeletal: Negative for arthralgias and myalgias.  Skin: Negative.  Negative for color change and pallor.  Neurological: Negative.  Negative for dizziness, weakness, light-headedness and headaches.  Hematological: Negative for adenopathy. Does not bruise/bleed easily.  Psychiatric/Behavioral: Negative.     Objective:  BP (!) 166/98 Comment: BP (R) 162/96 (L) 166/98  Pulse 94   Temp 98.3 F (36.8 C) (Oral)   Ht 5' 11.5" (1.816 m)   Wt 278 lb (126.1 kg)   SpO2 99%   BMI 38.23 kg/m   BP Readings from Last 3 Encounters:  03/29/20 (!) 166/98  07/22/11 Marland Kitchen)  150/87  07/15/11 (!) 158/79    Wt Readings from Last 3 Encounters:  03/29/20 278 lb (126.1 kg)  07/22/11 224 lb 6.4 oz (101.8 kg)  07/15/11 226 lb 12.8 oz (102.9 kg)    Physical Exam Vitals reviewed.  Constitutional:      Appearance: Normal appearance.  HENT:     Nose: Nose normal.     Mouth/Throat:     Mouth: Mucous membranes are moist.  Eyes:     General: No scleral icterus.    Conjunctiva/sclera:  Conjunctivae normal.  Cardiovascular:     Rate and Rhythm: Normal rate and regular rhythm.     Heart sounds: Normal heart sounds, S1 normal and S2 normal. No murmur heard. No gallop.      Comments: EKG- NSR, 72 bpm + minimal LVH No Q waves Pulmonary:     Effort: Pulmonary effort is normal.     Breath sounds: No stridor. No wheezing, rhonchi or rales.  Abdominal:     General: Abdomen is protuberant. There is no abdominal bruit.     Palpations: There is no hepatomegaly, splenomegaly or mass.     Tenderness: There is no abdominal tenderness.  Musculoskeletal:     Cervical back: Neck supple.     Right lower leg: No edema.     Left lower leg: No edema.  Lymphadenopathy:     Cervical: No cervical adenopathy.  Skin:    General: Skin is warm and dry.     Coloration: Skin is not pale.  Neurological:     General: No focal deficit present.     Mental Status: He is alert and oriented to person, place, and time.  Psychiatric:        Mood and Affect: Mood normal.        Behavior: Behavior normal.     Lab Results  Component Value Date   WBC 5.6 07/15/2011   HGB 14.5 07/15/2011   HCT 42.6 (A) 07/15/2011   PLT 214 05/05/2011   GLUCOSE 100 (H) 07/15/2011   CHOL 200 07/15/2011   TRIG 144 07/15/2011   HDL 51 07/15/2011   LDLCALC 120 (H) 07/15/2011   ALT 18 07/15/2011   AST 21 07/15/2011   NA 139 07/15/2011   K 4.1 07/15/2011   CL 102 07/15/2011   CREATININE 0.86 07/15/2011   BUN 13 07/15/2011   CO2 29 07/15/2011   TSH 1.213 07/15/2011    US Abdomen Complete  Result Date: 05/05/2011 *RADIOLOGY REPORT* Clinical Data:  Right upper quadrant pain epigastric pain.  Nausea. COMPLETE ABDOMINAL ULTRASOUND Comparison:  None. Findings: Gallbladder:  No gallstones, gallbladder wall thickening, or pericholecystic fluid. Common bile duct:  Between 4 mm and 5 mm, normal. Liver:  Echogenic liver with mildly heterogeneous echotexture, most compatible with hepatic steatosis.  No focal mass  lesions.  No intrahepatic biliary ductal dilation. IVC:  Appears normal. Pancreas:  Obscured by overlying bowel gas. Spleen:  46 mm with normal echotexture. Right Kidney:  11.3 cm. Normal echotexture.  Normal central sinus echo complex.  No calculi or hydronephrosis. Left Kidney:  11.9 cm.  Probable extrarenal pelvis.  Normal echotexture.  No calculi. Abdominal aorta:  2 cm, normal. IMPRESSION: 1.  Negative for cholelithiasis or cholecystitis. 2.  Echogenic liver, likely representing hepatic steatosis. Original Report Authenticated By: Andreas Newport, M.D.   Assessment & Plan:   Tiger was seen today for annual exam and hypertension.  Diagnoses and all orders for this visit:  NASH (nonalcoholic steatohepatitis)- I will  monitor his hepatic function and will screen for viral hepatitis. -     Protime-INR; Future -     Hepatitis B surface antibody,quantitative; Future -     Hepatitis B surface antigen; Future -     Hepatic function panel; Future -     Hepatitis C antibody; Future -     Hepatitis B core antibody, total; Future -     Hepatitis A antibody, total; Future  Primary hypertension- His blood pressure is not well controlled.  Will check labs to screen for secondary causes and endorgan damage.  His EKG is positive for LVH.  Will start an ARB and thiazide diuretic. -     CBC with Differential/Platelet; Future -     Basic metabolic panel; Future -     TSH; Future -     Urinalysis, Routine w reflex microscopic; Future -     VITAMIN D 25 Hydroxy (Vit-D Deficiency, Fractures); Future -     EKG 12-Lead -     irbesartan (AVAPRO) 150 MG tablet; Take 1 tablet (150 mg total) by mouth daily. -     indapamide (LOZOL) 1.25 MG tablet; Take 1 tablet (1.25 mg total) by mouth daily.  Encounter for general adult medical examination with abnormal findings- Exam completed, labs reviewed, vaccines reviewed and updated, no cancer screenings are indicated, patient education was given. -     Lipid panel;  Future -     Hepatitis C antibody; Future -     HIV Antibody (routine testing w rflx); Future  Loud snoring- I recommended that he be evaluated for sleep apnea. -     Ambulatory referral to Sleep Studies  LVH (left ventricular hypertrophy) due to hypertensive disease, without heart failure -     irbesartan (AVAPRO) 150 MG tablet; Take 1 tablet (150 mg total) by mouth daily. -     indapamide (LOZOL) 1.25 MG tablet; Take 1 tablet (1.25 mg total) by mouth daily.  Other orders -     Tdap vaccine greater than or equal to 7yo IM   I have discontinued Algie G. Purdie's (Flaxseed, Linseed, (FLAX SEED OIL PO)), MILK THISTLE PO, ibuprofen, pantoprazole, and amLODipine. I am also having him start on irbesartan and indapamide. Additionally, I am having him maintain his fish oil-omega-3 fatty acids, multivitamin with minerals, and acetaminophen.  Meds ordered this encounter  Medications  . irbesartan (AVAPRO) 150 MG tablet    Sig: Take 1 tablet (150 mg total) by mouth daily.    Dispense:  90 tablet    Refill:  0  . indapamide (LOZOL) 1.25 MG tablet    Sig: Take 1 tablet (1.25 mg total) by mouth daily.    Dispense:  90 tablet    Refill:  0     Follow-up: Return in about 6 weeks (around 05/10/2020).  Sanda Linger, MD

## 2020-04-03 ENCOUNTER — Encounter: Payer: Self-pay | Admitting: Internal Medicine

## 2020-05-01 ENCOUNTER — Encounter: Payer: Self-pay | Admitting: Neurology

## 2020-05-01 ENCOUNTER — Institutional Professional Consult (permissible substitution): Payer: Self-pay | Admitting: Neurology

## 2020-05-04 ENCOUNTER — Telehealth: Payer: Self-pay

## 2020-05-04 NOTE — Telephone Encounter (Signed)
Pt no showed 05/01/20 appt with Dr. Frances Furbish

## 2020-06-01 ENCOUNTER — Ambulatory Visit (INDEPENDENT_AMBULATORY_CARE_PROVIDER_SITE_OTHER): Payer: BC Managed Care – PPO | Admitting: Neurology

## 2020-06-01 ENCOUNTER — Encounter: Payer: Self-pay | Admitting: Neurology

## 2020-06-01 VITALS — BP 153/79 | HR 99 | Ht 72.0 in | Wt 282.0 lb

## 2020-06-01 DIAGNOSIS — R351 Nocturia: Secondary | ICD-10-CM

## 2020-06-01 DIAGNOSIS — E669 Obesity, unspecified: Secondary | ICD-10-CM

## 2020-06-01 DIAGNOSIS — G4719 Other hypersomnia: Secondary | ICD-10-CM

## 2020-06-01 DIAGNOSIS — Z9189 Other specified personal risk factors, not elsewhere classified: Secondary | ICD-10-CM | POA: Diagnosis not present

## 2020-06-01 DIAGNOSIS — R0683 Snoring: Secondary | ICD-10-CM | POA: Diagnosis not present

## 2020-06-01 DIAGNOSIS — Z820 Family history of epilepsy and other diseases of the nervous system: Secondary | ICD-10-CM

## 2020-06-01 NOTE — Progress Notes (Signed)
Subjective:    Patient ID: Erik Boyd is a 40 y.o. male.  HPI     Erik Foley, MD, PhD [facility] [address], [address] [address] [place], [place] [address]  Dear Dr. Yetta Barre,   I saw your patient, Erik Boyd, upon your kind request, in my sleep clinic today for initial consultation of his Sleep disorder, in particular, concern for underlying obstructive sleep apnea.  The patient is unaccompanied today. He missed an appointment on 05/01/20. As you know, Erik Boyd is a 40 year old right-handed gentleman with an underlying medical history of hypertension, NASH, and obesity, who reports snoring and excessive daytime somnolence.  I reviewed your office note from 03/29/2020.  His Epworth sleepiness score is 6/24, fatigue severity score is 38 out of 63.  He is working on weight loss.  He is supposed to get blood work done.  He does not wake up fully rested.  He works as a Art therapist, works in Airline pilot.  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  He is married and lives with his family which includes his wife and 5 children, 3 biological and 2 adopted, ages 94 to 40 years old.  He is typically in bed by 4030 or 11, he has a rise time of 4 AM.  He has nocturia which is variable, and average of once per night.  He denies recurrent morning headaches.  His father and his brother both have sleep apnea in both of machines.  He drinks no caffeine on a day-to-day basis with the exception of a preworkout.  He is in the gym by 5 AM typically.  He is a non-smoker and drinks alcohol rarely.  His snoring can be loud and bothersome to his wife.  His Past Medical History Is Significant For: Past Medical History:  Diagnosis Date  . Hypertension 2013  . NASH (nonalcoholic steatohepatitis) 2013    His Past Surgical History Is Significant For: History reviewed. No pertinent surgical history.  His Family History Is Significant For: Family History  Problem Relation  Age of Onset  . COPD Mother   . Hypertension Father   . Hypertension Brother   . Hypertension Brother     His Social History Is Significant For: Social History   Socioeconomic History  . Marital status: Single    Spouse name: Not on file  . Number of children: Not on file  . Years of education: Not on file  . Highest education level: Not on file  Occupational History  . Not on file  Tobacco Use  . Smoking status: Never Smoker  . Smokeless tobacco: Never Used  Substance and Sexual Activity  . Alcohol use: Not Currently    Comment: social  . Drug use: No  . Sexual activity: Not Currently    Partners: Female  Other Topics Concern  . Not on file  Social History Narrative  . Not on file   Social Determinants of Health   Financial Resource Strain: Not on file  Food Insecurity: Not on file  Transportation Needs: Not on file  Physical Activity: Not on file  Stress: Not on file  Social Connections: Not on file    His Allergies Are:  No Known Allergies:   His Current Medications Are:  Outpatient Encounter Medications as of 06/01/2020  Medication Sig  . acetaminophen (TYLENOL) 500 MG tablet Take 1,000 mg by mouth every 6 (six) hours as needed. For pain.  . fish oil-omega-3  fatty acids 1000 MG capsule Take 1 g by mouth 3 (three) times daily.  . indapamide (LOZOL) 1.25 MG tablet Take 1 tablet (1.25 mg total) by mouth daily.  . irbesartan (AVAPRO) 150 MG tablet Take 1 tablet (150 mg total) by mouth daily.  . Multiple Vitamin (MULITIVITAMIN WITH MINERALS) TABS Take 1 tablet by mouth daily.   No facility-administered encounter medications on file as of 06/01/2020.  :   Review of Systems:  Out of a complete 14 point review of systems, all are reviewed and negative with the exception of these symptoms as listed below:  Review of Systems  Neurological:       Here for sleep consult. No prior sleep study, snoring and daytime fatigue is present.  Epworth Sleepiness Scale 0=  would never doze 1= slight chance of dozing 2= moderate chance of dozing 3= high chance of dozing  Sitting and reading:1 Watching TV:1 Sitting inactive in a public place (ex. Theater or meeting):3 As a passenger in a car for an hour without a break:0 Lying down to rest in the afternoon:1 Sitting and talking to someone:0 Sitting quietly after lunch (no alcohol):0 In a car, while stopped in traffic:0 Total:6     Objective:  Neurological Exam  Physical Exam Physical Examination:   Vitals:   06/01/20 1508  BP: (!) 153/79  Pulse: 99    General Examination: The patient is a very pleasant 40 y.o. male in no acute distress. He appears well-developed and well-nourished and well groomed.   HEENT: Normocephalic, atraumatic, pupils are equal, round and reactive to light, extraocular tracking is good without limitation to gaze excursion or nystagmus noted. Hearing is grossly intact. Face is symmetric with normal facial animation. Speech is clear with no dysarthria noted. There is no hypophonia. There is no lip, neck/head, jaw or voice tremor. Neck is supple with full range of passive and active motion. There are no carotid bruits on auscultation. Oropharynx exam reveals: mild mouth dryness, good dental hygiene and moderate airway crowding, due to small airway entry, longer appearing uvula, tonsillar size of 1-2+.  Mallampati class II.  Neck circumference of 18-7/8 inches.  Tongue protrudes centrally and palate elevates symmetrically.  Chest: Clear to auscultation without wheezing, rhonchi or crackles noted.  Heart: S1+S2+0, regular and normal without murmurs, rubs or gallops noted.   Abdomen: Soft, non-tender and non-distended with normal bowel sounds appreciated on auscultation.  Extremities: There is no pitting edema in the distal lower extremities bilaterally.   Skin: Warm and dry without trophic changes noted.   Musculoskeletal: exam reveals no obvious joint deformities, tenderness  or joint swelling or erythema.   Neurologically:  Mental status: The patient is awake, alert and oriented in all 4 spheres. His immediate and remote memory, attention, language skills and fund of knowledge are appropriate. There is no evidence of aphasia, agnosia, apraxia or anomia. Speech is clear with normal prosody and enunciation. Thought process is linear. Mood is normal and affect is normal.  Cranial nerves II - XII are as described above under HEENT exam.  Motor exam: Normal bulk, strength and tone is noted. There is no tremor, Romberg is negative. Fine motor skills and coordination: grossly intact.  Cerebellar testing: No dysmetria or intention tremor. There is no truncal or gait ataxia.  Sensory exam: intact to light touch in the upper and lower extremities.  Gait, station and balance: He stands easily. No veering to one side is noted. No leaning to one side is noted. Posture  is age-appropriate and stance is narrow based. Gait shows normal stride length and normal pace. No problems turning are noted. Tandem walk is unremarkable.                Assessment and Plan:   In summary, JOSIAH NIETO is a very pleasant 40 y.o.-year old male with an underlying medical history of hypertension, NASH, and obesity, whose history and physical exam are concerning for obstructive sleep apnea (OSA). I had a long chat with the patient about my findings and the diagnosis of OSA, its prognosis and treatment options. We talked about medical treatments, surgical interventions and non-pharmacological approaches. I explained in particular the risks and ramifications of untreated moderate to severe OSA, especially with respect to developing cardiovascular disease down the Road, including congestive heart failure, difficult to treat hypertension, cardiac arrhythmias, or stroke. Even type 2 diabetes has, in part, been linked to untreated OSA. Symptoms of untreated OSA include daytime sleepiness, memory problems, mood  irritability and mood disorder such as depression and anxiety, lack of energy, as well as recurrent headaches, especially morning headaches. We talked about trying to maintain a healthy lifestyle in general, as well as the importance of weight control. We also talked about the importance of good sleep hygiene. I recommended the following at this time: sleep study.   I explained the sleep test procedure to the patient and also outlined possible surgical and non-surgical treatment options of OSA, including the use of a custom-made dental device (which would require a referral to a specialist dentist or oral surgeon), upper airway surgical options, such as traditional UPPP or a novel less invasive surgical option in the form of Inspire hypoglossal nerve stimulation (which would involve a referral to an ENT surgeon). I also explained the CPAP treatment option to the patient, who indicated that he would be willing to try CPAP if the need arises. I explained the importance of being compliant with PAP treatment, not only for insurance purposes but primarily to improve His symptoms, and for the patient's long term health benefit, including to reduce His cardiovascular risks. I answered all his questions today and the patient was in agreement. I plan to see him back after the sleep study is completed and encouraged him to call with any interim questions, concerns, problems or updates.   Thank you very much for allowing me to participate in the care of this nice patient. If I can be of any further assistance to you please do not hesitate to call me at [phone].  Sincerely,   Erik Foley, MD, PhD

## 2020-06-01 NOTE — Patient Instructions (Signed)

## 2020-06-20 ENCOUNTER — Other Ambulatory Visit: Payer: Self-pay | Admitting: Internal Medicine

## 2020-06-20 DIAGNOSIS — I119 Hypertensive heart disease without heart failure: Secondary | ICD-10-CM

## 2020-06-20 DIAGNOSIS — I10 Essential (primary) hypertension: Secondary | ICD-10-CM

## 2020-06-22 ENCOUNTER — Other Ambulatory Visit: Payer: Self-pay

## 2020-06-22 ENCOUNTER — Ambulatory Visit (INDEPENDENT_AMBULATORY_CARE_PROVIDER_SITE_OTHER): Payer: BC Managed Care – PPO | Admitting: Neurology

## 2020-06-22 DIAGNOSIS — Z9189 Other specified personal risk factors, not elsewhere classified: Secondary | ICD-10-CM

## 2020-06-22 DIAGNOSIS — G4733 Obstructive sleep apnea (adult) (pediatric): Secondary | ICD-10-CM

## 2020-06-22 DIAGNOSIS — E669 Obesity, unspecified: Secondary | ICD-10-CM

## 2020-06-22 DIAGNOSIS — Z820 Family history of epilepsy and other diseases of the nervous system: Secondary | ICD-10-CM

## 2020-06-22 DIAGNOSIS — R0683 Snoring: Secondary | ICD-10-CM

## 2020-06-22 DIAGNOSIS — R351 Nocturia: Secondary | ICD-10-CM

## 2020-06-22 DIAGNOSIS — G4719 Other hypersomnia: Secondary | ICD-10-CM

## 2020-06-29 ENCOUNTER — Encounter: Payer: Self-pay | Admitting: Neurology

## 2020-06-29 NOTE — Procedures (Signed)
PATIENT'S NAME:  Erik Boyd, Erik Boyd DOB:      11/08/1980      MR#:    [id number]     DATE OF RECORDING: 06/22/2020 REFERRING M.D.:  Sanda Linger MD Study Performed:  Split-Night Titration Study HISTORY: 40 year old man with a history of hypertension, NASH, and obesity, who reports snoring and excessive daytime somnolence. The patient endorsed the Epworth Sleepiness Scale at 6 points. The patient's weight 282 pounds with a height of 72 (inches), resulting in a BMI of 38.2 kg/m2. The patient's neck circumference measured 18.8 inches.  CURRENT MEDICATIONS: Tylenol, fish oil, Lozol, Avapro, Multivitamins   PROCEDURE:  This is a multichannel digital polysomnogram utilizing the Somnostar 11.2 system.  Electrodes and sensors were applied and monitored per AASM Specifications.   EEG, EOG, Chin and Limb EMG, were sampled at 200 Hz.  ECG, Snore and Nasal Pressure, Thermal Airflow, Respiratory Effort, CPAP Flow and Pressure, Oximetry was sampled at 50 Hz. Digital video and audio were recorded.      BASELINE STUDY WITHOUT CPAP RESULTS:  Lights Out was at 21:51 and Lights On at 04:01 for the night, split start at 00:23, epoch 311.  Total recording time (TRT) was 152, with a total sleep time (TST) of 125.5 minutes.   The patient's sleep latency was 31.5 minutes.  REM sleep was absent prior to PAP initiation. The sleep efficiency was 82.6 %.    SLEEP ARCHITECTURE: WASO (Wake after sleep onset) was 11 minutes, Stage N1 was 3 minutes, Stage N2 was 56.5 minutes, Stage N3 was 66 minutes and Stage R (REM sleep) was 0 minutes.  The percentages were Stage N1 2.4%, Stage N2 45.%, Stage N3 52.6% and Stage R (REM sleep) was absent. The arousals were noted as: 21 were spontaneous, 0 were associated with PLMs, 39 were associated with respiratory events.  RESPIRATORY ANALYSIS:  There were a total of 69 respiratory events:  24 obstructive apneas, 0 central apneas and 0 mixed apneas with a total of 24 apneas and an apnea index (AI) of  11.5. There were 45 hypopneas with a hypopnea index of 21.5. The patient also had 0 respiratory event related arousals (RERAs).  Snoring was noted.     The total APNEA/HYPOPNEA INDEX (AHI) was 33. /hour and the total RESPIRATORY DISTURBANCE INDEX was 33. /hour.  0 events occurred in REM sleep and 90 events in NREM. The REM AHI was n/a versus a non-REM AHI of 33. /hour. The patient spent 113.5 minutes sleep time in the supine position 208 minutes in non-supine. The supine AHI was 91.5 /hour versus a non-supine AHI of 21.2 /hour.  OXYGEN SATURATION & C02:  The wake baseline 02 saturation was 94%, with the lowest being 79%. Time spent below 89% saturation equaled 24 minutes.  PERIODIC LIMB MOVEMENTS: The patient had a total of 0 Periodic Limb Movements.  The Periodic Limb Movement (PLM) index was 0 /hour and the PLM Arousal index was 0 /hour. Audio and video analysis did not show any abnormal or unusual movements, behaviors, phonations or vocalizations. The patient took 1 bathroom break. Mild to moderate snoring was noted. The EKG was in keeping with normal sinus rhythm (NSR).   TITRATION STUDY WITH CPAP RESULTS:   The patient was fitted with a medium E son to a nasal mask.  CPAP was initiated at 5 cm per AASM split night standards and gradually titrated to a final pressure of 9 cm.  On a pressure of 9 cm, his AHI was 2.7/h, with  supine REM sleep achieved and O2 nadir briefly at 89%.   Total recording time (TRT) was 218.5 minutes, with a total sleep time (TST) of 196 minutes. The patient's sleep latency was 14.5 minutes. REM latency was 28.5 minutes.  The sleep efficiency was 89.7 %.    SLEEP ARCHITECTURE: Wake after sleep was 7 minutes, Stage N1 0.5 minutes, Stage N2 111.5 minutes, Stage N3 23.5 minutes and Stage R (REM sleep) 60.5 minutes. The percentages were: Stage N1 .3%, Stage N2 56.9%, Stage N3 12.% and Stage R (REM sleep) 30.9%, in keeping with rebound.   The arousals were noted as: 15 were  spontaneous, 0 were associated with PLMs, 12 were associated with respiratory events.  RESPIRATORY ANALYSIS:  There were a total of 17 respiratory events: 0 obstructive apneas, 2 central apneas and 1 mixed apneas with a total of 3 apneas and an apnea index (AI) of .9. There were 14 hypopneas with a hypopnea index of 4.3 /hour. The patient also had 0 respiratory event related arousals (RERAs).      The total APNEA/HYPOPNEA INDEX  (AHI) was 5.2 /hour and the total RESPIRATORY DISTURBANCE INDEX was 5.2 /hour.  3 events occurred in REM sleep and 14 events in NREM. The REM AHI was 3. /hour versus a non-REM AHI of 6.2 /hour. The patient spent 47% of total sleep time in the supine position. The supine AHI was 11.0 /hour, versus a non-supine AHI of 0.0/hour.  OXYGEN SATURATION & C02:  The wake baseline 02 saturation was 96%, with the lowest being 89%. Time spent below 89% saturation equaled 0 minutes.  PERIODIC LIMB MOVEMENTS:    The patient had a total of 0 Periodic Limb Movements. The Periodic Limb Movement (PLM) index was 0 /hour and the PLM Arousal index was 0 /hour.  Post-study, the patient indicated that sleep was better than usual.  POLYSOMNOGRAPHY IMPRESSION:   1. Severe Obstructive Sleep Apnea (OSA)   RECOMMENDATIONS:  1. This patient severe obstructive sleep apnea and responded well to CPAP therapy. His baseline AHI was 33/hour, O2 nadir 79%, REM sleep was absent during the baseline portion of the study. I will prescribe home CPAP therapy at a pressure of 9 cm via medium nasal mask, with (heated) humidity. The patient will be advised to be fully compliant with PAP therapy to improve sleep related symptoms and decrease long term cardiovascular risks. Please note, that untreated obstructive sleep apnea may carry additional perioperative morbidity. Patients with significant obstructive sleep apnea should receive perioperative PAP therapy and the surgeons and particularly the anesthesiologist should  be informed of the diagnosis and the severity of the sleep disordered breathing. 2. The patient should be cautioned not to drive, work at heights, or operate dangerous or heavy equipment when tired or sleepy. Review and reiteration of good sleep hygiene measures should be pursued with any patient. 3. The patient will be seen in follow-up in the sleep clinic at [facility] for discussion of the test results, symptom and treatment compliance review, further management strategies, etc. The referring provider will be notified of the test results.  I certify that I have reviewed the entire raw data recording prior to the issuance of this report in accordance with the Standards of Accreditation of the American Academy of Sleep Medicine (AASM)    Huston Foley, MD, PhD Diplomat, American Board of Neurology and Sleep Medicine (Neurology and Sleep Medicine)

## 2020-06-29 NOTE — Progress Notes (Signed)
Patient referred by Dr. Yetta Barre, seen by me on 06/01/20, split night sleep study on 06/22/20. Please call and notify patient that the recent sleep study showed severe OSA. He did well with CPAP during the study with significant improvement of the respiratory events. Therefore, I would like start the patient on CPAP therapy at home by prescribing a machine for home use. I placed the order in the chart.  Please advise patient that we need a follow up appointment with either myself or one of our nurse practitioners in about 10 weeks post set-up to check for how the patient is feeling and how well the patient is using the machine, etc. Pls advise pt about the importance of keeping this window for the FU appointment, as it is often an insurance requirement. Failing to adhere to this may result in losing coverage for sleep apnea treatment, at which point most patients are left with a choice of returning the machine or paying out of pocket (and we want neither of this to happen!).  Please re-enforce the importance of compliance with treatment and the need for [place] to monitor compliance data - again an insurance requirement and usually a good feedback for the patient as far as how they are doing.  Also remind patient, that any PAP machine or mask issues should be first addressed with the DME company, who provided the machine/mask.  Please ask if patient has a preference regarding DME company, may depend on the insurance too.

## 2020-06-29 NOTE — Addendum Note (Signed)
Addended by: Huston Foley on: 06/29/2020 06:04 PM   Modules accepted: Orders

## 2020-09-05 DIAGNOSIS — U071 COVID-19: Secondary | ICD-10-CM | POA: Diagnosis not present

## 2020-09-05 DIAGNOSIS — Z20822 Contact with and (suspected) exposure to covid-19: Secondary | ICD-10-CM | POA: Diagnosis not present

## 2020-09-07 DIAGNOSIS — Z20822 Contact with and (suspected) exposure to covid-19: Secondary | ICD-10-CM | POA: Diagnosis not present

## 2020-09-13 DIAGNOSIS — G4733 Obstructive sleep apnea (adult) (pediatric): Secondary | ICD-10-CM | POA: Diagnosis not present

## 2020-09-21 ENCOUNTER — Other Ambulatory Visit: Payer: Self-pay | Admitting: Internal Medicine

## 2020-09-21 DIAGNOSIS — I119 Hypertensive heart disease without heart failure: Secondary | ICD-10-CM

## 2020-09-21 DIAGNOSIS — I10 Essential (primary) hypertension: Secondary | ICD-10-CM

## 2020-09-25 ENCOUNTER — Telehealth: Payer: Self-pay | Admitting: Internal Medicine

## 2020-09-25 ENCOUNTER — Other Ambulatory Visit: Payer: Self-pay | Admitting: Internal Medicine

## 2020-09-25 DIAGNOSIS — I10 Essential (primary) hypertension: Secondary | ICD-10-CM

## 2020-09-25 DIAGNOSIS — I119 Hypertensive heart disease without heart failure: Secondary | ICD-10-CM

## 2020-09-25 MED ORDER — IRBESARTAN 150 MG PO TABS: 150.0000 mg | ORAL_TABLET | Freq: Every day | ORAL | 0 refills | Status: DC

## 2020-09-25 MED ORDER — INDAPAMIDE 1.25 MG PO TABS: 1.2500 mg | ORAL_TABLET | Freq: Every day | ORAL | 0 refills | Status: DC

## 2020-09-25 NOTE — Telephone Encounter (Signed)
   Patient is requesting a short supply of  indapamide (LOZOL) 1.25 MG tablet irbesartan (AVAPRO) 150 MG tablet  [id number] - [place], [place] - [address]. Patient is requesting a call back if short supply cannot be sent in

## 2020-10-13 DIAGNOSIS — G4733 Obstructive sleep apnea (adult) (pediatric): Secondary | ICD-10-CM | POA: Diagnosis not present

## 2020-10-24 ENCOUNTER — Ambulatory Visit: Payer: BC Managed Care – PPO | Admitting: Internal Medicine

## 2020-11-02 DIAGNOSIS — M25522 Pain in left elbow: Secondary | ICD-10-CM | POA: Diagnosis not present

## 2020-11-09 DIAGNOSIS — M7022 Olecranon bursitis, left elbow: Secondary | ICD-10-CM | POA: Diagnosis not present

## 2020-11-13 DIAGNOSIS — G4733 Obstructive sleep apnea (adult) (pediatric): Secondary | ICD-10-CM | POA: Diagnosis not present

## 2020-12-14 DIAGNOSIS — G4733 Obstructive sleep apnea (adult) (pediatric): Secondary | ICD-10-CM | POA: Diagnosis not present

## 2020-12-22 ENCOUNTER — Other Ambulatory Visit: Payer: Self-pay | Admitting: Internal Medicine

## 2020-12-22 DIAGNOSIS — I119 Hypertensive heart disease without heart failure: Secondary | ICD-10-CM

## 2020-12-22 DIAGNOSIS — I10 Essential (primary) hypertension: Secondary | ICD-10-CM

## 2020-12-26 ENCOUNTER — Other Ambulatory Visit: Payer: Self-pay | Admitting: Internal Medicine

## 2020-12-26 DIAGNOSIS — I10 Essential (primary) hypertension: Secondary | ICD-10-CM

## 2020-12-26 DIAGNOSIS — I119 Hypertensive heart disease without heart failure: Secondary | ICD-10-CM

## 2020-12-26 MED ORDER — IRBESARTAN 150 MG PO TABS: 150.0000 mg | ORAL_TABLET | Freq: Every day | ORAL | 0 refills | Status: DC

## 2020-12-26 MED ORDER — INDAPAMIDE 1.25 MG PO TABS: 1.2500 mg | ORAL_TABLET | Freq: Every day | ORAL | 0 refills | Status: DC

## 2021-01-13 DIAGNOSIS — G4733 Obstructive sleep apnea (adult) (pediatric): Secondary | ICD-10-CM | POA: Diagnosis not present

## 2021-02-13 DIAGNOSIS — G4733 Obstructive sleep apnea (adult) (pediatric): Secondary | ICD-10-CM | POA: Diagnosis not present

## 2021-03-12 ENCOUNTER — Other Ambulatory Visit: Payer: Self-pay | Admitting: Internal Medicine

## 2021-03-12 DIAGNOSIS — I10 Essential (primary) hypertension: Secondary | ICD-10-CM

## 2021-03-12 DIAGNOSIS — I119 Hypertensive heart disease without heart failure: Secondary | ICD-10-CM

## 2021-03-13 MED ORDER — INDAPAMIDE 1.25 MG PO TABS
1.2500 mg | ORAL_TABLET | Freq: Every day | ORAL | 0 refills | Status: DC
Start: 2021-03-13 — End: 2021-06-04

## 2021-03-13 MED ORDER — IRBESARTAN 150 MG PO TABS: 150.0000 mg | ORAL_TABLET | Freq: Every day | ORAL | 0 refills | Status: DC

## 2021-03-15 DIAGNOSIS — G4733 Obstructive sleep apnea (adult) (pediatric): Secondary | ICD-10-CM | POA: Diagnosis not present

## 2021-04-15 DIAGNOSIS — G4733 Obstructive sleep apnea (adult) (pediatric): Secondary | ICD-10-CM | POA: Diagnosis not present

## 2021-05-15 DIAGNOSIS — G4733 Obstructive sleep apnea (adult) (pediatric): Secondary | ICD-10-CM | POA: Diagnosis not present

## 2021-06-03 ENCOUNTER — Other Ambulatory Visit: Payer: Self-pay | Admitting: Internal Medicine

## 2021-06-03 DIAGNOSIS — I10 Essential (primary) hypertension: Secondary | ICD-10-CM

## 2021-06-03 DIAGNOSIS — I119 Hypertensive heart disease without heart failure: Secondary | ICD-10-CM

## 2021-06-04 ENCOUNTER — Other Ambulatory Visit: Payer: Self-pay | Admitting: Internal Medicine

## 2021-06-04 ENCOUNTER — Encounter: Payer: Self-pay | Admitting: Internal Medicine

## 2021-06-04 DIAGNOSIS — I119 Hypertensive heart disease without heart failure: Secondary | ICD-10-CM

## 2021-06-04 DIAGNOSIS — I10 Essential (primary) hypertension: Secondary | ICD-10-CM

## 2021-06-04 MED ORDER — INDAPAMIDE 1.25 MG PO TABS: 1.2500 mg | ORAL_TABLET | Freq: Every day | ORAL | 0 refills | Status: AC

## 2021-06-04 MED ORDER — IRBESARTAN 150 MG PO TABS: 150.0000 mg | ORAL_TABLET | Freq: Every day | ORAL | 0 refills | Status: AC

## 2021-07-11 ENCOUNTER — Ambulatory Visit: Payer: BC Managed Care – PPO | Admitting: Internal Medicine

## 2021-07-12 ENCOUNTER — Other Ambulatory Visit: Payer: Self-pay | Admitting: Internal Medicine

## 2021-07-12 DIAGNOSIS — I10 Essential (primary) hypertension: Secondary | ICD-10-CM

## 2021-07-12 DIAGNOSIS — I119 Hypertensive heart disease without heart failure: Secondary | ICD-10-CM

## 2021-11-23 ENCOUNTER — Emergency Department (HOSPITAL_COMMUNITY)
Admission: EM | Admit: 2021-11-23 | Discharge: 2021-11-23 | Disposition: A | Discharge status: Home / Self Care | Payer: BC Managed Care – PPO | Attending: Emergency Medicine | Admitting: Emergency Medicine

## 2021-11-23 ENCOUNTER — Other Ambulatory Visit: Payer: Self-pay

## 2021-11-23 ENCOUNTER — Emergency Department (HOSPITAL_COMMUNITY): Payer: BC Managed Care – PPO

## 2021-11-23 ENCOUNTER — Encounter (HOSPITAL_COMMUNITY): Payer: Self-pay

## 2021-11-23 DIAGNOSIS — R519 Headache, unspecified: Secondary | ICD-10-CM | POA: Insufficient documentation

## 2021-11-23 DIAGNOSIS — S199XXA Unspecified injury of neck, initial encounter: Secondary | ICD-10-CM | POA: Diagnosis not present

## 2021-11-23 DIAGNOSIS — Y92410 Unspecified street and highway as the place of occurrence of the external cause: Secondary | ICD-10-CM | POA: Insufficient documentation

## 2021-11-23 DIAGNOSIS — S0990XA Unspecified injury of head, initial encounter: Secondary | ICD-10-CM | POA: Diagnosis not present

## 2021-11-23 DIAGNOSIS — S161XXA Strain of muscle, fascia and tendon at neck level, initial encounter: Secondary | ICD-10-CM | POA: Diagnosis not present

## 2021-11-23 DIAGNOSIS — M25511 Pain in right shoulder: Secondary | ICD-10-CM | POA: Diagnosis not present

## 2021-11-23 MED ORDER — METHOCARBAMOL 500 MG PO TABS: 500.0000 mg | ORAL_TABLET | Freq: Two times a day (BID) | ORAL | 0 refills | Status: AC

## 2021-11-23 MED ORDER — IBUPROFEN 200 MG PO TABS
600.0000 mg | ORAL_TABLET | Freq: Once | ORAL | Status: AC
  Administered 2021-11-23: 600 mg via ORAL
  Filled 2021-11-23: qty 3
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
## 2021-11-23 NOTE — Discharge Instructions (Addendum)

## 2022-01-03 DIAGNOSIS — G4733 Obstructive sleep apnea (adult) (pediatric): Secondary | ICD-10-CM | POA: Diagnosis not present

## 2022-01-09 ENCOUNTER — Encounter: Payer: Self-pay | Admitting: *Deleted

## 2022-02-03 DIAGNOSIS — G4733 Obstructive sleep apnea (adult) (pediatric): Secondary | ICD-10-CM | POA: Diagnosis not present

## 2022-03-05 DIAGNOSIS — G4733 Obstructive sleep apnea (adult) (pediatric): Secondary | ICD-10-CM | POA: Diagnosis not present

## 2023-10-13 ENCOUNTER — Encounter: Payer: Self-pay | Admitting: Emergency Medicine

## 2023-10-13 ENCOUNTER — Ambulatory Visit: Admission: EM | Admit: 2023-10-13 | Discharge: 2023-10-13 | Disposition: A | Discharge status: Home / Self Care

## 2023-10-13 DIAGNOSIS — L309 Dermatitis, unspecified: Secondary | ICD-10-CM

## 2023-10-13 MED ORDER — MUPIROCIN 2 % EX OINT: 1.0000 | TOPICAL_OINTMENT | Freq: Two times a day (BID) | CUTANEOUS | 0 refills | Status: AC

## 2023-10-13 MED ORDER — DOXYCYCLINE HYCLATE 100 MG PO CAPS: 100.0000 mg | ORAL_CAPSULE | Freq: Two times a day (BID) | ORAL | 0 refills | Status: AC

## 2023-10-13 NOTE — ED Provider Notes (Signed)
 [facility]    CSN: [id number] Arrival date & time: 10/13/23  9176      History   Chief Complaint Chief Complaint  Patient presents with  . Rash    HPI Erik Boyd is a 43 y.o. male.    Rash Associated symptoms: no fever     Past Medical History:  Diagnosis Date  . Hypertension 2013  . NASH (nonalcoholic steatohepatitis) 2013    Patient Active Problem List   Diagnosis Date Noted  . NASH (nonalcoholic steatohepatitis) 03/29/2020  . Loud snoring 03/29/2020  . Encounter for general adult medical examination with abnormal findings 03/29/2020  . LVH (left ventricular hypertrophy) due to hypertensive disease, without heart failure 03/29/2020  . High blood pressure 02/27/2019  . Feels sick 04/19/2011    History reviewed. No pertinent surgical history.     Home Medications    Prior to Admission medications   Medication Sig Start Date End Date Taking? Authorizing Provider  acetaminophen (TYLENOL) 500 MG tablet Take 1,000 mg by mouth every 6 (six) hours as needed. For pain.   Yes [provider]  fish oil-omega-3 fatty acids 1000 MG capsule Take 1 g by mouth 3 (three) times daily.   Yes [provider]  Multiple Vitamin (MULITIVITAMIN WITH MINERALS) TABS Take 1 tablet by mouth daily.   Yes [provider]  telmisartan (MICARDIS) 80 MG tablet Take 80 mg by mouth daily. 09/22/23  Yes [provider]  indapamide  (LOZOL ) 1.25 MG tablet Take 1 tablet (1.25 mg total) by mouth daily. Patient not taking: Reported on 10/13/2023 06/04/21   Joshua Debby CROME, MD  irbesartan  (AVAPRO ) 150 MG tablet Take 1 tablet (150 mg total) by mouth daily. Patient not taking: Reported on 10/13/2023 06/04/21   Joshua Debby CROME, MD  methocarbamol  (ROBAXIN ) 500 MG tablet Take 1 tablet (500 mg total) by mouth 2 (two) times daily. Patient not taking: Reported on 10/13/2023 11/23/21   Neldon Hamp RAMAN, PA    Family History Family History  Problem Relation  Age of Onset  . COPD Mother   . Hypertension Father   . Hypertension Brother   . Hypertension Brother     Social History Social History   Tobacco Use  . Smoking status: Never  . Smokeless tobacco: Never  Vaping Use  . Vaping status: Never Used  Substance Use Topics  . Alcohol use: Not Currently    Comment: social  . Drug use: No     Allergies   Patient has no known allergies.   Review of Systems Review of Systems  Constitutional:  Negative for chills and fever.  Eyes:  Negative for discharge and redness.  Neurological:  Negative for numbness.     Physical Exam Triage Vital Signs ED Triage Vitals [10/13/23 0839]  Encounter Vitals Group     BP 138/89     Girls Systolic BP Percentile      Girls Diastolic BP Percentile      Boys Systolic BP Percentile      Boys Diastolic BP Percentile      Pulse Rate 81     Resp 14     Temp 97.6 F (36.4 C)     Temp Source Oral     SpO2 96 %     Weight      Height      Head Circumference      Peak Flow      Pain Score 2     Pain Loc  Pain Education      Exclude from Growth Chart    No data found.  Updated Vital Signs BP 138/89 (BP Location: Left Arm)   Pulse 81   Temp 97.6 F (36.4 C) (Oral)   Resp 14   SpO2 96%   Visual Acuity Right Eye Distance:   Left Eye Distance:   Bilateral Distance:    Right Eye Near:   Left Eye Near:    Bilateral Near:     Physical Exam Vitals and nursing note reviewed.  Constitutional:      General: He is not in acute distress.    Appearance: Normal appearance. He is not ill-appearing.  HENT:     Head: Normocephalic and atraumatic.  Eyes:     Conjunctiva/sclera: Conjunctivae normal.  Cardiovascular:     Rate and Rhythm: Normal rate.  Pulmonary:     Effort: Pulmonary effort is normal.  Neurological:     Mental Status: He is alert.  Psychiatric:        Mood and Affect: Mood normal.        Behavior: Behavior normal.        Thought Content: Thought content normal.       UC Treatments / Results  Labs (all labs ordered are listed, but only abnormal results are displayed) Labs Reviewed - No data to display  EKG   Radiology No results found.  Procedures Procedures (including critical care time)  Medications Ordered in UC Medications - No data to display  Initial Impression / Assessment and Plan / UC Course  I have reviewed the triage vital signs and the nursing notes.  Pertinent labs & imaging results that were available during my care of the patient were reviewed by me and considered in my medical decision making (see chart for details).     *** Final Clinical Impressions(s) / UC Diagnoses   Final diagnoses:  None   Discharge Instructions   None    ED Prescriptions   None    PDMP not reviewed this encounter.

## 2023-10-13 NOTE — ED Triage Notes (Signed)
 Pt reports rash to R cheek and under beard x3 days. Notes prior to rash, he had gotten his face shaved at barber. Notes some heat to the area and mild pain.
